# Patient Record
Sex: Female | Born: 1960 | Race: Black or African American | Hispanic: No | Smoking: Current some day smoker
Health system: Southern US, Community
[De-identification: ages and names within clinical notes are randomized; demographics above are authoritative.]

## PROBLEM LIST (undated history)

## (undated) DIAGNOSIS — R7303 Prediabetes: Secondary | ICD-10-CM

## (undated) DIAGNOSIS — F32A Depression, unspecified: Secondary | ICD-10-CM

## (undated) DIAGNOSIS — I10 Essential (primary) hypertension: Secondary | ICD-10-CM

## (undated) DIAGNOSIS — J45909 Unspecified asthma, uncomplicated: Secondary | ICD-10-CM

## (undated) DIAGNOSIS — F329 Major depressive disorder, single episode, unspecified: Secondary | ICD-10-CM

## (undated) DIAGNOSIS — F419 Anxiety disorder, unspecified: Secondary | ICD-10-CM

## (undated) DIAGNOSIS — K7460 Unspecified cirrhosis of liver: Secondary | ICD-10-CM

## (undated) DIAGNOSIS — J189 Pneumonia, unspecified organism: Secondary | ICD-10-CM

## (undated) DIAGNOSIS — K5900 Constipation, unspecified: Secondary | ICD-10-CM

## (undated) DIAGNOSIS — K739 Chronic hepatitis, unspecified: Secondary | ICD-10-CM

## (undated) DIAGNOSIS — E7800 Pure hypercholesterolemia, unspecified: Secondary | ICD-10-CM

## (undated) DIAGNOSIS — M1990 Unspecified osteoarthritis, unspecified site: Secondary | ICD-10-CM

## (undated) DIAGNOSIS — K219 Gastro-esophageal reflux disease without esophagitis: Secondary | ICD-10-CM

## (undated) HISTORY — PX: ABDOMINAL HYSTERECTOMY: SHX81

---

## 2004-07-20 ENCOUNTER — Emergency Department: Payer: Self-pay | Admitting: Internal Medicine

## 2010-01-19 ENCOUNTER — Ambulatory Visit: Payer: Self-pay

## 2010-02-12 ENCOUNTER — Emergency Department: Payer: Self-pay | Admitting: Emergency Medicine

## 2010-04-02 ENCOUNTER — Ambulatory Visit: Payer: Self-pay | Admitting: Adult Health

## 2010-04-02 ENCOUNTER — Inpatient Hospital Stay: Payer: Self-pay | Admitting: Internal Medicine

## 2010-04-22 ENCOUNTER — Emergency Department: Payer: Self-pay | Admitting: Internal Medicine

## 2010-05-07 ENCOUNTER — Ambulatory Visit: Payer: Self-pay

## 2010-06-06 ENCOUNTER — Emergency Department: Payer: Self-pay | Admitting: Emergency Medicine

## 2012-01-12 IMAGING — CR DG CHEST 2V
1 series · 2 of 2 positions shown · non-contrast
Comparison: none

REASON FOR EXAM: chest pain
COMMENTS:

PROCEDURE:     DXR - DXR CHEST PA (OR AP) AND LATERAL  - January 19, 2010  [DATE]
RESULT:     Comparison: None

[Series 1: view not recorded · 0.17mm/px · 2 of 2 slices shown]
[im 1/2]
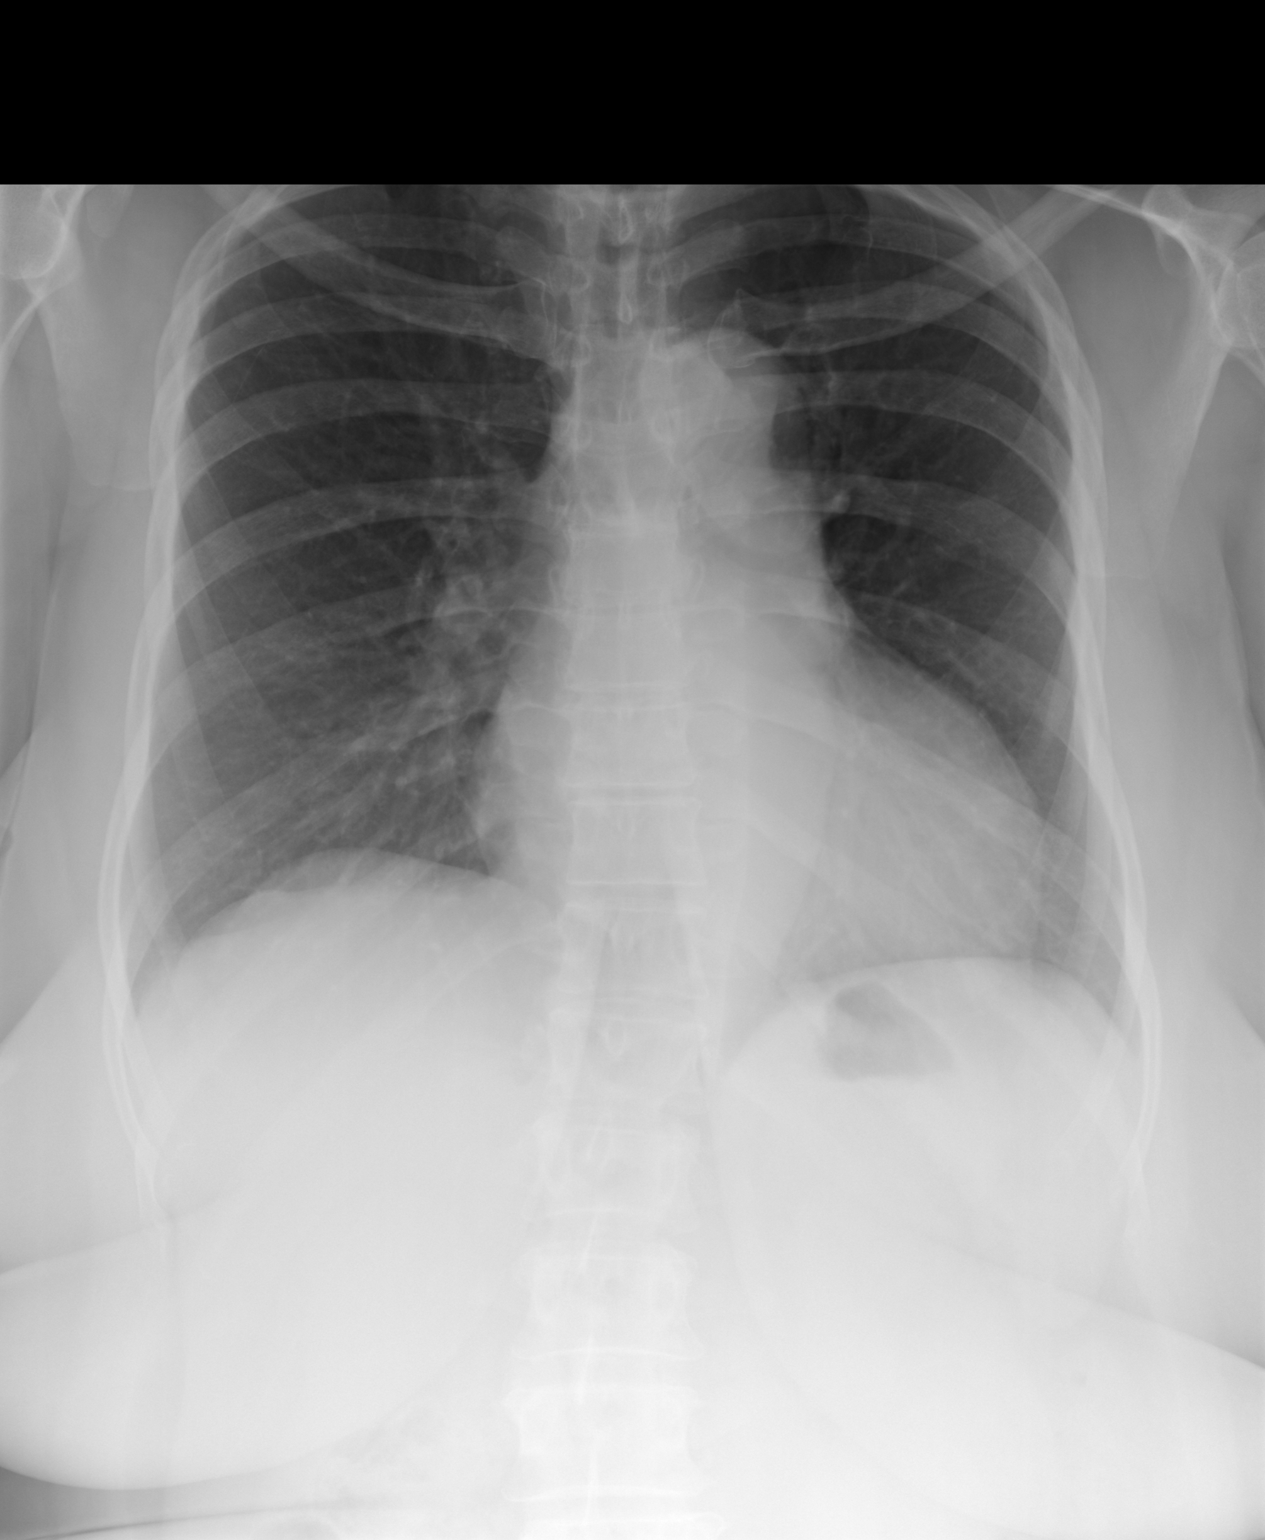
[im 2/2]
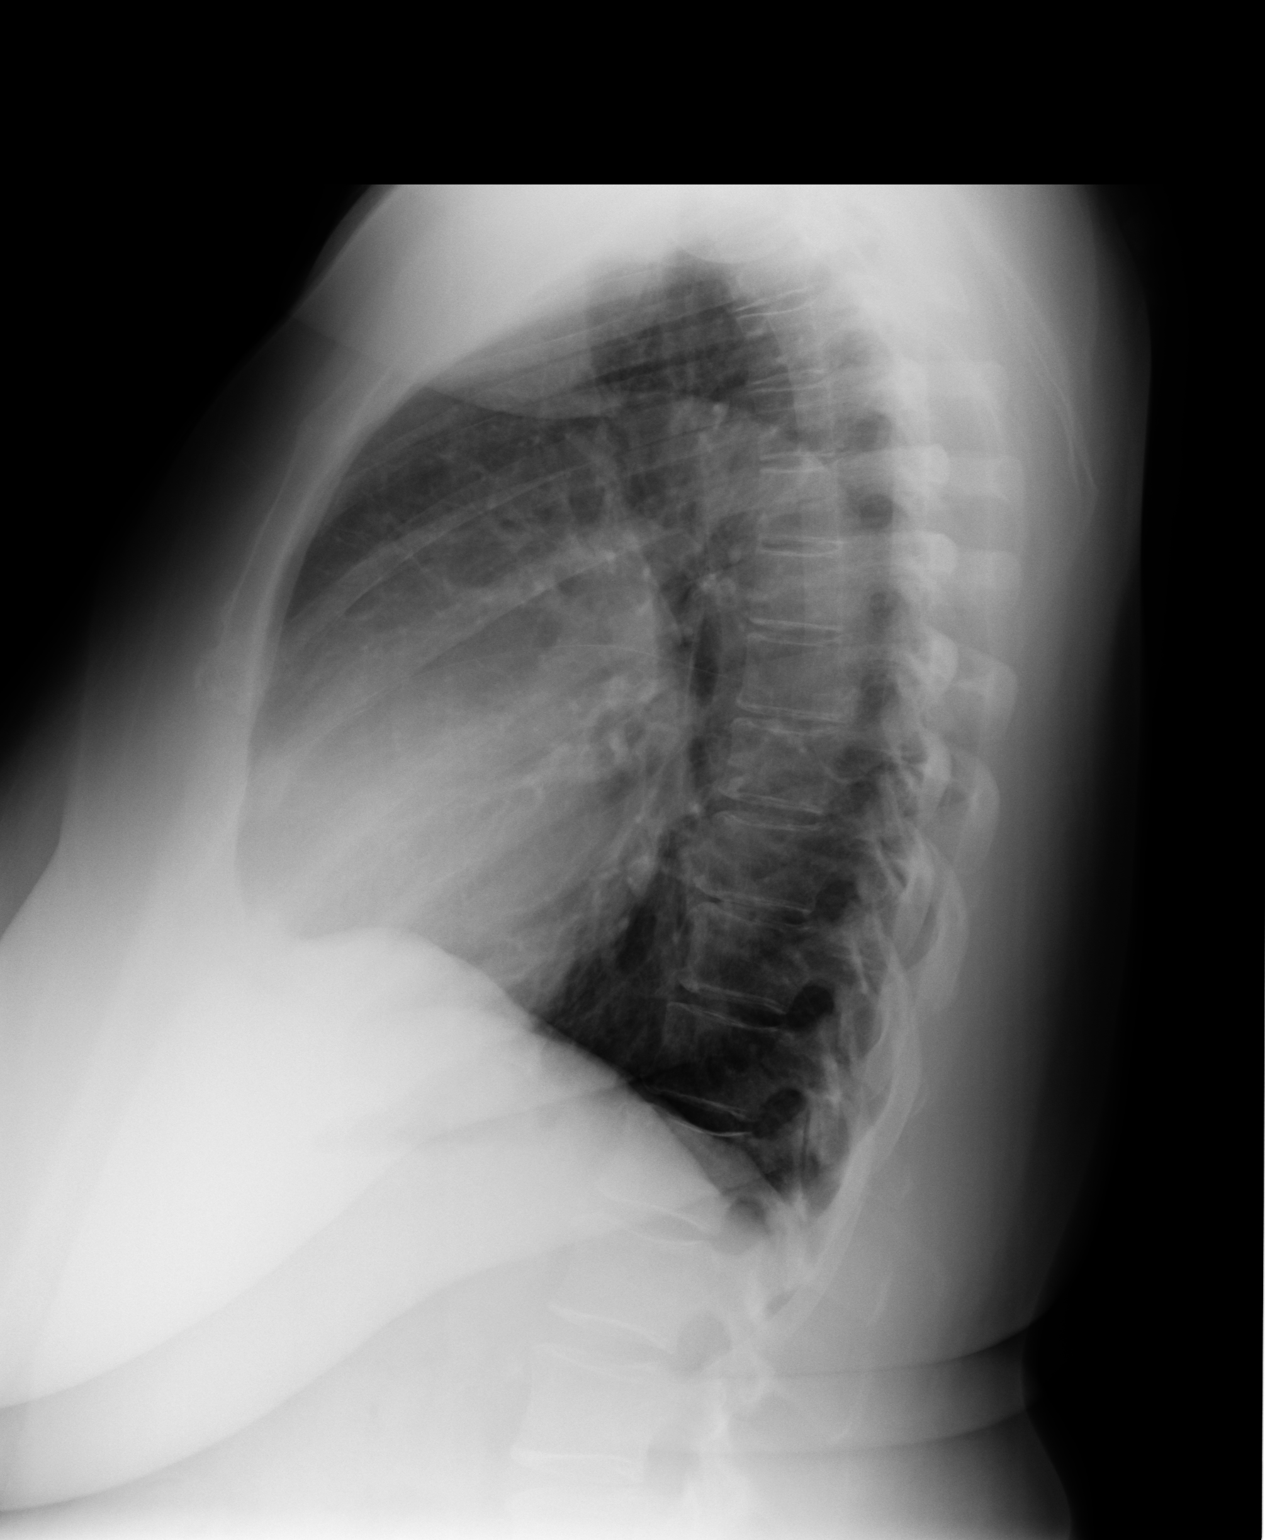

[2 of 2 positions shown; findings below may reference images not displayed]

FINDINGS: PA and lateral chest radiographs are provided. There is no focal parenchymal
opacity, pleural effusion, or pneumothorax. The heart and mediastinum are
unremarkable. The osseous structures are unremarkable.
IMPRESSION: No acute disease of the chest.

## 2012-04-27 ENCOUNTER — Ambulatory Visit: Payer: Self-pay | Admitting: Emergency Medicine

## 2012-04-30 LAB — PATHOLOGY REPORT

## 2012-07-15 ENCOUNTER — Ambulatory Visit: Payer: Self-pay | Admitting: Emergency Medicine

## 2012-12-06 ENCOUNTER — Ambulatory Visit: Payer: Self-pay | Admitting: Family Medicine

## 2014-05-24 ENCOUNTER — Ambulatory Visit: Payer: Self-pay | Admitting: Family Medicine

## 2014-08-24 ENCOUNTER — Encounter: Payer: Self-pay | Admitting: Nurse Practitioner

## 2014-08-28 ENCOUNTER — Encounter: Payer: Self-pay | Admitting: Nurse Practitioner

## 2015-04-16 ENCOUNTER — Other Ambulatory Visit: Payer: Self-pay | Admitting: Nurse Practitioner

## 2015-04-16 DIAGNOSIS — F17200 Nicotine dependence, unspecified, uncomplicated: Secondary | ICD-10-CM

## 2015-04-16 DIAGNOSIS — R0602 Shortness of breath: Secondary | ICD-10-CM

## 2015-04-24 ENCOUNTER — Ambulatory Visit
Admission: RE | Admit: 2015-04-24 | Discharge: 2015-04-24 | Disposition: A | Discharge status: Home / Self Care | Payer: Medicaid Other | Source: Ambulatory Visit | Attending: Nurse Practitioner | Admitting: Nurse Practitioner

## 2015-04-24 DIAGNOSIS — F17210 Nicotine dependence, cigarettes, uncomplicated: Secondary | ICD-10-CM | POA: Insufficient documentation

## 2015-04-24 DIAGNOSIS — F17200 Nicotine dependence, unspecified, uncomplicated: Secondary | ICD-10-CM

## 2015-04-24 DIAGNOSIS — R0602 Shortness of breath: Secondary | ICD-10-CM | POA: Diagnosis not present

## 2015-10-19 ENCOUNTER — Other Ambulatory Visit: Payer: Self-pay | Admitting: Preventative Medicine

## 2015-10-19 DIAGNOSIS — Z1231 Encounter for screening mammogram for malignant neoplasm of breast: Secondary | ICD-10-CM

## 2015-11-06 ENCOUNTER — Ambulatory Visit: Payer: Medicaid Other | Attending: Preventative Medicine

## 2015-12-04 ENCOUNTER — Other Ambulatory Visit: Payer: Self-pay | Admitting: Student

## 2015-12-04 DIAGNOSIS — B182 Chronic viral hepatitis C: Secondary | ICD-10-CM

## 2015-12-11 ENCOUNTER — Ambulatory Visit
Admission: RE | Admit: 2015-12-11 | Discharge: 2015-12-11 | Disposition: A | Discharge status: Home / Self Care | Payer: Medicaid Other | Source: Ambulatory Visit | Attending: Student | Admitting: Student

## 2015-12-11 DIAGNOSIS — N281 Cyst of kidney, acquired: Secondary | ICD-10-CM | POA: Insufficient documentation

## 2015-12-11 DIAGNOSIS — K824 Cholesterolosis of gallbladder: Secondary | ICD-10-CM | POA: Insufficient documentation

## 2015-12-11 DIAGNOSIS — B182 Chronic viral hepatitis C: Secondary | ICD-10-CM | POA: Insufficient documentation

## 2016-03-21 ENCOUNTER — Encounter: Payer: Self-pay | Admitting: *Deleted

## 2016-03-24 ENCOUNTER — Ambulatory Visit: Payer: Medicaid Other | Admitting: Anesthesiology

## 2016-03-24 ENCOUNTER — Encounter
Admission: RE | Disposition: A | Discharge status: Home / Self Care | Payer: Self-pay | Source: Ambulatory Visit | Attending: Unknown Physician Specialty

## 2016-03-24 ENCOUNTER — Ambulatory Visit
Admission: RE | Admit: 2016-03-24 | Discharge: 2016-03-24 | Disposition: A | Discharge status: Home / Self Care | Payer: Medicaid Other | Source: Ambulatory Visit | Attending: Unknown Physician Specialty | Admitting: Unknown Physician Specialty

## 2016-03-24 ENCOUNTER — Encounter: Payer: Self-pay | Admitting: *Deleted

## 2016-03-24 DIAGNOSIS — R1310 Dysphagia, unspecified: Secondary | ICD-10-CM | POA: Insufficient documentation

## 2016-03-24 DIAGNOSIS — Z79899 Other long term (current) drug therapy: Secondary | ICD-10-CM | POA: Diagnosis not present

## 2016-03-24 DIAGNOSIS — M1990 Unspecified osteoarthritis, unspecified site: Secondary | ICD-10-CM | POA: Insufficient documentation

## 2016-03-24 DIAGNOSIS — I10 Essential (primary) hypertension: Secondary | ICD-10-CM | POA: Insufficient documentation

## 2016-03-24 DIAGNOSIS — F419 Anxiety disorder, unspecified: Secondary | ICD-10-CM | POA: Insufficient documentation

## 2016-03-24 DIAGNOSIS — E669 Obesity, unspecified: Secondary | ICD-10-CM | POA: Insufficient documentation

## 2016-03-24 DIAGNOSIS — Z8619 Personal history of other infectious and parasitic diseases: Secondary | ICD-10-CM | POA: Insufficient documentation

## 2016-03-24 DIAGNOSIS — F17200 Nicotine dependence, unspecified, uncomplicated: Secondary | ICD-10-CM | POA: Diagnosis not present

## 2016-03-24 DIAGNOSIS — Z7982 Long term (current) use of aspirin: Secondary | ICD-10-CM | POA: Insufficient documentation

## 2016-03-24 DIAGNOSIS — K5900 Constipation, unspecified: Secondary | ICD-10-CM | POA: Insufficient documentation

## 2016-03-24 DIAGNOSIS — K2970 Gastritis, unspecified, without bleeding: Secondary | ICD-10-CM | POA: Insufficient documentation

## 2016-03-24 DIAGNOSIS — Z6823 Body mass index (BMI) 23.0-23.9, adult: Secondary | ICD-10-CM | POA: Diagnosis not present

## 2016-03-24 DIAGNOSIS — F329 Major depressive disorder, single episode, unspecified: Secondary | ICD-10-CM | POA: Insufficient documentation

## 2016-03-24 DIAGNOSIS — K259 Gastric ulcer, unspecified as acute or chronic, without hemorrhage or perforation: Secondary | ICD-10-CM | POA: Diagnosis not present

## 2016-03-24 DIAGNOSIS — F17210 Nicotine dependence, cigarettes, uncomplicated: Secondary | ICD-10-CM | POA: Insufficient documentation

## 2016-03-24 DIAGNOSIS — R7303 Prediabetes: Secondary | ICD-10-CM | POA: Insufficient documentation

## 2016-03-24 DIAGNOSIS — E7800 Pure hypercholesterolemia, unspecified: Secondary | ICD-10-CM | POA: Diagnosis not present

## 2016-03-24 HISTORY — DX: Major depressive disorder, single episode, unspecified: F32.9

## 2016-03-24 HISTORY — DX: Anxiety disorder, unspecified: F41.9

## 2016-03-24 HISTORY — DX: Unspecified osteoarthritis, unspecified site: M19.90

## 2016-03-24 HISTORY — DX: Prediabetes: R73.03

## 2016-03-24 HISTORY — DX: Constipation, unspecified: K59.00

## 2016-03-24 HISTORY — DX: Chronic hepatitis, unspecified: K73.9

## 2016-03-24 HISTORY — DX: Pure hypercholesterolemia, unspecified: E78.00

## 2016-03-24 HISTORY — DX: Depression, unspecified: F32.A

## 2016-03-24 HISTORY — PX: ESOPHAGOGASTRODUODENOSCOPY (EGD) WITH PROPOFOL: SHX5813

## 2016-03-24 HISTORY — DX: Essential (primary) hypertension: I10

## 2016-03-24 SURGERY — ESOPHAGOGASTRODUODENOSCOPY (EGD) WITH PROPOFOL
Anesthesia: General

## 2016-03-24 MED ORDER — PROPOFOL 10 MG/ML IV BOLUS
INTRAVENOUS | Status: DC | PRN
  Administered 2016-03-24: 40 mg via INTRAVENOUS

## 2016-03-24 MED ORDER — SODIUM CHLORIDE 0.9 % IV SOLN
INTRAVENOUS | Status: DC
  Administered 2016-03-24: 13:00:00 via INTRAVENOUS

## 2016-03-24 MED ORDER — FENTANYL CITRATE (PF) 100 MCG/2ML IJ SOLN
INTRAMUSCULAR | Status: DC | PRN
  Administered 2016-03-24: 50 ug via INTRAVENOUS

## 2016-03-24 MED ORDER — PROPOFOL 500 MG/50ML IV EMUL
INTRAVENOUS | Status: DC | PRN
  Administered 2016-03-24: 100 ug/kg/min via INTRAVENOUS

## 2016-03-24 MED ORDER — SODIUM CHLORIDE 0.9 % IV SOLN: INTRAVENOUS | Status: DC

## 2016-03-24 MED ORDER — BUTAMBEN-TETRACAINE-BENZOCAINE 2-2-14 % EX AERO
INHALATION_SPRAY | CUTANEOUS | Status: DC | PRN
  Administered 2016-03-24: 1 via TOPICAL

## 2016-03-24 MED ORDER — LIDOCAINE HCL (CARDIAC) 20 MG/ML IV SOLN
INTRAVENOUS | Status: DC | PRN
  Administered 2016-03-24: 100 mg via INTRAVENOUS

## 2016-03-24 MED ORDER — GLYCOPYRROLATE 0.2 MG/ML IJ SOLN
INTRAMUSCULAR | Status: DC | PRN
  Administered 2016-03-24: 0.2 mg via INTRAVENOUS

## 2016-03-24 MED ORDER — DEXMEDETOMIDINE HCL 200 MCG/2ML IV SOLN
INTRAVENOUS | Status: DC | PRN
  Administered 2016-03-24: 12 ug via INTRAVENOUS

## 2016-03-24 MED ORDER — BUTAMBEN-TETRACAINE-BENZOCAINE 2-2-14 % EX AERO
INHALATION_SPRAY | CUTANEOUS | Status: AC
  Filled 2016-03-24: qty 20

## 2016-03-24 NOTE — H&P (Signed)
   Primary Care Physician:  Veneda MelterWHITE, CHRISTINA M, FNP Primary Gastroenterologist:  Dr. Mechele CollinElliott  Pre-Procedure History & Physical: HPI:  Donna ComeLillian Marie Phelps is a 55 y.o. female is here for an endoscopy.   Past Medical History:  Diagnosis Date  . Anxiety   . Arthritis   . Chronic hepatitis (HCC)   . Constipation   . Depression   . High cholesterol   . Hypertension   . Prediabetes     History reviewed. No pertinent surgical history.  Prior to Admission medications   Medication Sig Start Date End Date Taking? Authorizing Provider  albuterol (PROVENTIL HFA;VENTOLIN HFA) 108 (90 Base) MCG/ACT inhaler Inhale into the lungs daily.   Yes Historical Provider, MD  aspirin EC 81 MG tablet Take 81 mg by mouth daily.   Yes Historical Provider, MD  beclomethasone (QVAR) 40 MCG/ACT inhaler Inhale into the lungs 2 (two) times daily.   Yes Historical Provider, MD  lisinopril-hydrochlorothiazide (PRINZIDE,ZESTORETIC) 20-12.5 MG tablet Take 1 tablet by mouth daily.   Yes Historical Provider, MD  fluconazole (DIFLUCAN) 200 MG tablet Take 200 mg by mouth daily.    Historical Provider, MD  naproxen (NAPROSYN) 500 MG tablet Take 500 mg by mouth 2 (two) times daily with a meal.    Historical Provider, MD    Allergies as of 03/19/2016  . (Not on File)    History reviewed. No pertinent family history.  Social History   Social History  . Marital status: Legally Separated    Spouse name: N/A  . Number of children: N/A  . Years of education: N/A   Occupational History  . Not on file.   Social History Main Topics  . Smoking status: Current Every Day Smoker    Packs/day: 0.25    Years: 15.00    Types: Cigarettes  . Smokeless tobacco: Never Used  . Alcohol use No  . Drug use: No  . Sexual activity: Not on file   Other Topics Concern  . Not on file   Social History Narrative  . No narrative on file    Review of Systems: See HPI, otherwise negative ROS  Physical Exam: BP 124/78    Pulse (!) 56   Temp 97 F (36.1 C) (Tympanic)   Resp 20   Ht 5\' 7"  (1.702 m)   Wt 68 kg (150 lb)   SpO2 99%   BMI 23.49 kg/m  General:   Alert,  pleasant and cooperative in NAD Head:  Normocephalic and atraumatic. Neck:  Supple; no masses or thyromegaly. Lungs:  Clear throughout to auscultation.    Heart:  Regular rate and rhythm. Abdomen:  Soft, nontender and nondistended. Normal bowel sounds, without guarding, and without rebound.   Neurologic:  Alert and  oriented x4;  grossly normal neurologically.  Impression/Plan: Donna ComeLillian Marie Phelps is here for an endoscopy to be performed for dysphagia, epigastric abd pain.  Risks, benefits, limitations, and alternatives regarding  endoscopy have been reviewed with the patient.  Questions have been answered.  All parties agreeable.   Lynnae PrudeELLIOTT, ROBERT, MD  03/24/2016, 1:52 PM

## 2016-03-24 NOTE — Anesthesia Postprocedure Evaluation (Signed)
Anesthesia Post Note  Patient: Simonne ComeLillian Marie Neisen  Procedure(s) Performed: Procedure(s) (LRB): ESOPHAGOGASTRODUODENOSCOPY (EGD) WITH PROPOFOL (N/A)  Patient location during evaluation: PACU Anesthesia Type: General Level of consciousness: awake and alert Pain management: pain level controlled Vital Signs Assessment: post-procedure vital signs reviewed and stable Respiratory status: spontaneous breathing, nonlabored ventilation, respiratory function stable and patient connected to nasal cannula oxygen Cardiovascular status: blood pressure returned to baseline and stable Postop Assessment: no signs of nausea or vomiting Anesthetic complications: no    Last Vitals:  Vitals:   03/24/16 1251 03/24/16 1420  BP: 124/78 108/74  Pulse: (!) 56 (!) 55  Resp: 20 10  Temp: 36.1 C (!) 36 C    Last Pain:  Vitals:   03/24/16 1420  TempSrc: Tympanic                 Yevette EdwardsJames G Ellan Tess

## 2016-03-24 NOTE — Op Note (Signed)
[facility] Gastroenterology Patient Name: Donna BlonderLillian Phelps Procedure Date: 03/24/2016 1:41 PM MRN: [id number] Account #: [id number] Date of Birth: [birth year] Admit Type: Outpatient Age: [age] [facility]OM [facility] Gender: Female Note Status: Finalized Procedure:            Upper GI endoscopy Indications:          Epigastric abdominal pain, Dysphagia, Heartburn Providers:            Scot Junobert T. Elliott, MD Referring MD:         Valentina Shaggyhristina M. White (Referring MD) Medicines:            Propofol per Anesthesia Complications:        No immediate complications. Procedure:            Pre-Anesthesia Assessment:                       - After reviewing the risks and benefits, the patient                        was deemed in satisfactory condition to undergo the                        procedure.                       After obtaining informed consent, the endoscope was                        passed under direct vision. Throughout the procedure,                        the patient's blood pressure, pulse, and oxygen                        saturations were monitored continuously. The Endoscope                        was introduced through the mouth, and advanced to the                        second part of duodenum. The upper GI endoscopy was                        accomplished without difficulty. The patient tolerated                        the procedure well. Findings:      The examined esophagus was normal. GEJ 38cm.      Five non-bleeding cratered and superficial gastric ulcers with no       stigmata of bleeding were found in the gastric antrum. The largest       lesion was 6 mm in largest dimension.      Localized moderate inflammation characterized by erythema and       granularity was found in the gastric antrum. Biopsies were taken with a       cold forceps for histology. Biopsies were taken with a cold forceps for       Helicobacter pylori testing.      The examined  duodenum was normal. Impression:           - Normal  esophagus.                       - Non-bleeding gastric ulcers with no stigmata of                        bleeding.                       - Gastritis. Biopsied.                       - Normal examined duodenum. Recommendation:       - Await pathology results. Scot Jun, MD 03/24/2016 2:13:19 PM This report has been signed electronically. Number of Addenda: 0 Note Initiated On: 03/24/2016 1:41 PM      [facility]

## 2016-03-24 NOTE — Transfer of Care (Signed)
Immediate Anesthesia Transfer of Care Note  Patient: Donna Phelps  Procedure(s) Performed: Procedure(s): ESOPHAGOGASTRODUODENOSCOPY (EGD) WITH PROPOFOL (N/A)  Patient Location: PACU  Anesthesia Type:General  Level of Consciousness: sedated  Airway & Oxygen Therapy: Patient Spontanous Breathing and Patient connected to nasal cannula oxygen  Post-op Assessment: Report given to RN and Post -op Vital signs reviewed and stable  Post vital signs: Reviewed and stable  Last Vitals:  Vitals:   03/24/16 1251  BP: 124/78  Pulse: (!) 56  Resp: 20  Temp: 36.1 C    Last Pain:  Vitals:   03/24/16 1251  TempSrc: Tympanic         Complications: No apparent anesthesia complications

## 2016-03-24 NOTE — Anesthesia Preprocedure Evaluation (Signed)
Anesthesia Evaluation  Patient identified by MRN, date of birth, ID band Patient awake    Reviewed: Allergy & Precautions, H&P , NPO status , Patient's Chart, lab work & pertinent test results, reviewed documented beta blocker date and time   History of Anesthesia Complications Negative for: history of anesthetic complications  Airway Mallampati: III  TM Distance: >3 FB Neck ROM: full    Dental no notable dental hx. (+) Missing, Poor Dentition, Chipped   Pulmonary neg shortness of breath, neg sleep apnea, neg COPD, neg recent URI, Current Smoker,    Pulmonary exam normal breath sounds clear to auscultation       Cardiovascular Exercise Tolerance: Good hypertension, On Medications (-) angina(-) CAD, (-) Past MI, (-) Cardiac Stents and (-) CABG Normal cardiovascular exam(-) dysrhythmias (-) Valvular Problems/Murmurs Rhythm:regular Rate:Normal     Neuro/Psych PSYCHIATRIC DISORDERS (Depression) negative neurological ROS     GI/Hepatic GERD  ,(+) Hepatitis -  Endo/Other  diabetes (Borderline)  Renal/GU negative Renal ROS  negative genitourinary   Musculoskeletal   Abdominal   Peds  Hematology negative hematology ROS (+)   Anesthesia Other Findings Past Medical History: No date: Anxiety No date: Arthritis No date: Chronic hepatitis (HCC) No date: Constipation No date: Depression No date: High cholesterol No date: Hypertension No date: Prediabetes   Reproductive/Obstetrics negative OB ROS                             Anesthesia Physical Anesthesia Plan  ASA: III  Anesthesia Plan: General   Post-op Pain Management:    Induction:   Airway Management Planned:   Additional Equipment:   Intra-op Plan:   Post-operative Plan:   Informed Consent: I have reviewed the patients History and Physical, chart, labs and discussed the procedure including the risks, benefits and alternatives  for the proposed anesthesia with the patient or authorized representative who has indicated his/her understanding and acceptance.   Dental Advisory Given  Plan Discussed with: Anesthesiologist, CRNA and Surgeon  Anesthesia Plan Comments:         Anesthesia Quick Evaluation

## 2016-03-24 NOTE — Transfer of Care (Signed)
Immediate Anesthesia Transfer of Care Note  Patient: Donna Phelps  Procedure(s) Performed: Procedure(s): ESOPHAGOGASTRODUODENOSCOPY (EGD) WITH PROPOFOL (N/A)  Patient Location: PACU  Anesthesia Type:General  Level of Consciousness: awake, alert  and oriented  Airway & Oxygen Therapy: Patient Spontanous Breathing and Patient connected to nasal cannula oxygen  Post-op Assessment: Report given to RN and Post -op Vital signs reviewed and stable  Post vital signs: Reviewed and stable  Last Vitals:  Vitals:   03/24/16 1251  BP: 124/78  Pulse: (!) 56  Resp: 20  Temp: 36.1 C    Last Pain:  Vitals:   03/24/16 1251  TempSrc: Tympanic         Complications: No apparent anesthesia complications

## 2016-04-02 LAB — SURGICAL PATHOLOGY

## 2016-05-08 ENCOUNTER — Encounter: Payer: Self-pay | Admitting: Unknown Physician Specialty

## 2016-05-10 ENCOUNTER — Emergency Department
Admission: EM | Admit: 2016-05-10 | Discharge: 2016-05-10 | Disposition: A | Discharge status: Home / Self Care | Payer: Medicaid Other | Attending: Emergency Medicine | Admitting: Emergency Medicine

## 2016-05-10 ENCOUNTER — Encounter: Payer: Self-pay | Admitting: Emergency Medicine

## 2016-05-10 ENCOUNTER — Emergency Department: Payer: Medicaid Other

## 2016-05-10 DIAGNOSIS — I10 Essential (primary) hypertension: Secondary | ICD-10-CM | POA: Insufficient documentation

## 2016-05-10 DIAGNOSIS — Z7982 Long term (current) use of aspirin: Secondary | ICD-10-CM | POA: Insufficient documentation

## 2016-05-10 DIAGNOSIS — H60501 Unspecified acute noninfective otitis externa, right ear: Secondary | ICD-10-CM | POA: Insufficient documentation

## 2016-05-10 DIAGNOSIS — H9201 Otalgia, right ear: Secondary | ICD-10-CM | POA: Diagnosis present

## 2016-05-10 DIAGNOSIS — F17210 Nicotine dependence, cigarettes, uncomplicated: Secondary | ICD-10-CM | POA: Insufficient documentation

## 2016-05-10 DIAGNOSIS — Z79899 Other long term (current) drug therapy: Secondary | ICD-10-CM | POA: Insufficient documentation

## 2016-05-10 DIAGNOSIS — L03211 Cellulitis of face: Secondary | ICD-10-CM | POA: Insufficient documentation

## 2016-05-10 LAB — CBC
HCT: 40.1 % (ref 35.0–47.0)
Hemoglobin: 13.8 g/dL (ref 12.0–16.0)
MCH: 29.4 pg (ref 26.0–34.0)
MCHC: 34.4 g/dL (ref 32.0–36.0)
MCV: 85.6 fL (ref 80.0–100.0)
Platelets: 168 10*3/uL (ref 150–440)
RBC: 4.69 MIL/uL (ref 3.80–5.20)
RDW: 13.7 % (ref 11.5–14.5)
WBC: 9.9 10*3/uL (ref 3.6–11.0)

## 2016-05-10 LAB — BASIC METABOLIC PANEL
Anion gap: 7 (ref 5–15)
BUN: 12 mg/dL (ref 6–20)
CO2: 28 mmol/L (ref 22–32)
Calcium: 9.6 mg/dL (ref 8.9–10.3)
Chloride: 103 mmol/L (ref 101–111)
Creatinine, Ser: 0.82 mg/dL (ref 0.44–1.00)
GFR calc Af Amer: >60 mL/min (ref >60)
GFR calc non Af Amer: >60 mL/min (ref >60)
Glucose, Bld: 103 mg/dL — ABNORMAL HIGH (ref 65–99)
Potassium: 3.4 mmol/L — ABNORMAL LOW (ref 3.5–5.1)
Sodium: 138 mmol/L (ref 135–145)

## 2016-05-10 MED ORDER — CLINDAMYCIN HCL 300 MG PO CAPS: 300.0000 mg | ORAL_CAPSULE | Freq: Three times a day (TID) | ORAL | 0 refills | Status: AC

## 2016-05-10 MED ORDER — OXYCODONE-ACETAMINOPHEN 5-325 MG PO TABS: 1.0000 | ORAL_TABLET | Freq: Four times a day (QID) | ORAL | 0 refills | Status: DC | PRN

## 2016-05-10 MED ORDER — IOPAMIDOL (ISOVUE-300) INJECTION 61%
75.0000 mL | Freq: Once | INTRAVENOUS | Status: AC | PRN
  Administered 2016-05-10: 75 mL via INTRAVENOUS
  Filled 2016-05-10: qty 75

## 2016-05-10 MED ORDER — MORPHINE SULFATE (PF) 4 MG/ML IV SOLN
4.0000 mg | Freq: Once | INTRAVENOUS | Status: AC
  Administered 2016-05-10: 4 mg via INTRAVENOUS
  Filled 2016-05-10: qty 1

## 2016-05-10 MED ORDER — PIPERACILLIN-TAZOBACTAM 3.375 G IVPB 30 MIN
3.3750 g | Freq: Once | INTRAVENOUS | Status: AC
  Administered 2016-05-10: 3.375 g via INTRAVENOUS
  Filled 2016-05-10: qty 50

## 2016-05-10 MED ORDER — MORPHINE SULFATE (PF) 4 MG/ML IV SOLN
INTRAVENOUS | Status: AC
  Administered 2016-05-10: 4 mg via INTRAVENOUS
  Filled 2016-05-10: qty 1

## 2016-05-10 MED ORDER — OFLOXACIN 0.3 % OP SOLN: OPHTHALMIC | 0 refills | Status: AC

## 2016-05-10 MED ORDER — IOPAMIDOL (ISOVUE-300) INJECTION 61%
100.0000 mL | Freq: Once | INTRAVENOUS | Status: DC | PRN
  Filled 2016-05-10: qty 100

## 2016-05-10 MED ORDER — MORPHINE SULFATE (PF) 4 MG/ML IV SOLN
4.0000 mg | Freq: Once | INTRAVENOUS | Status: AC
  Administered 2016-05-10: 4 mg via INTRAVENOUS

## 2016-05-10 NOTE — ED Provider Notes (Signed)
[facility] -  Emergency Department Provider Note  Time seen: 9:47 AM  I have reviewed the triage vital signs and the nursing notes.   HISTORY  Chief Complaint Otalgia    HPI Donna Phelps is a 55 y.o. female with a past medical history of hypertension, hyperlipidemia, who presents the emergency department for right ear pain. According to the patient for the past one week she has been having progressively worsening right ear pain. States drainage of blood and pus from the right ear since yesterday. Denies fever. Denies vomiting. Patient states the pain is moderate to severe in an around the right ear. States her hearing is muffled out of the right ear. Denies any vomiting.  Past Medical History:  Diagnosis Date  . Anxiety   . Arthritis   . Chronic hepatitis (HCC)   . Constipation   . Depression   . High cholesterol   . Hypertension   . Prediabetes     There are no active problems to display for this patient.   Past Surgical History:  Procedure Laterality Date  . ESOPHAGOGASTRODUODENOSCOPY (EGD) WITH PROPOFOL N/A 03/24/2016   Procedure: ESOPHAGOGASTRODUODENOSCOPY (EGD) WITH PROPOFOL;  Surgeon: Scot Jun, MD;  Location: [facility] ENDOSCOPY;  Service: Endoscopy;  Laterality: N/A;    Prior to Admission medications   Medication Sig Start Date End Date Taking? Authorizing Provider  albuterol (PROVENTIL HFA;VENTOLIN HFA) 108 (90 Base) MCG/ACT inhaler Inhale into the lungs daily.    Historical Provider, MD  aspirin EC 81 MG tablet Take 81 mg by mouth daily.    Historical Provider, MD  beclomethasone (QVAR) 40 MCG/ACT inhaler Inhale into the lungs 2 (two) times daily.    Historical Provider, MD  fluconazole (DIFLUCAN) 200 MG tablet Take 200 mg by mouth daily.    Historical Provider, MD  lisinopril-hydrochlorothiazide (PRINZIDE,ZESTORETIC) 20-12.5 MG tablet Take 1 tablet by mouth daily.    Historical Provider, MD  naproxen (NAPROSYN) 500 MG tablet Take  500 mg by mouth 2 (two) times daily with a meal.    Historical Provider, MD    No Known Allergies  No family history on file.  Social History Social History  Substance Use Topics  . Smoking status: Current Every Day Smoker    Packs/day: 0.25    Years: 15.00    Types: Cigarettes  . Smokeless tobacco: Never Used  . Alcohol use No    Review of Systems Constitutional: Negative for fever. ENT: Positive right ear pain and drainage. Cardiovascular: Negative for chest pain. Respiratory: Negative for shortness of breath. Gastrointestinal: Negative for abdominal pain Skin: Negative for rash. Neurological: Mild headache. 10-point ROS otherwise negative.  ____________________________________________   PHYSICAL EXAM:  VITAL SIGNS: ED Triage Vitals [05/10/16 0910]  Enc Vitals Group     BP (!) 150/94     Pulse Rate 69     Resp 20     Temp 99.3 F (37.4 C)     Temp Source Oral     SpO2 97 %     Weight 165 lb (74.8 kg)     Height 5\' 6"  (1.676 m)     Head Circumference      Peak Flow      Pain Score 10     Pain Loc      Pain Edu?      Excl. in GC?     Constitutional: Alert and oriented. Well appearing and in no distress. Eyes: Normal exam ENT   Head: Normocephalic. Moderate tenderness to  palpation around the right ear, with moderate edema, mild erythema especially anterior to the right ear. Auditory canal is swollen due to the swelling anterior to the right ear. Mild tenderness to palpation over the mastoid process. There appears to be blood and exudate draining from the right ear.   Nose: No congestion/rhinnorhea.   Mouth/Throat: Mucous membranes are moist. Cardiovascular: Normal rate, regular rhythm. No murmur Respiratory: Normal respiratory effort without tachypnea nor retractions. Breath sounds are clear Gastrointestinal: Soft and nontender. No distention.  Musculoskeletal: Nontender with normal range of motion in all extremities.  Neurologic:  Normal speech  and language. No gross focal neurologic deficits  Skin:  Skin is warm, dry and intact.  Psychiatric: Mood and affect are normal.  ____________________________________________   RADIOLOGY  CT shows lesion in the right ear canal, infectious versus inflammatory.  ____________________________________________   INITIAL IMPRESSION / ASSESSMENT AND PLAN / ED COURSE  Pertinent labs & imaging results that were available during my care of the patient were reviewed by me and considered in my medical decision making (see chart for details).  Patient appears to have an infection of an around the right ear, however given the degree of tenderness and swelling anterior to the auditory canal with tenderness over the mastoid process we will proceed with a CT of the face with contrast to evaluate for abscess. We will treat with pain medication, pain labs and a CT scan to further evaluate. Patient is agreeable to this plan.  CT appears to show infectious lesion. Lesion is spontaneously draining. We'll place the patient on clindamycin 3 times daily for the next 10 days have her follow-up with a primary care doctor in 2-3 days for recheck since reevaluation. I discussed strict return precautions, the patient is agreeable.  ____________________________________________   FINAL CLINICAL IMPRESSION(S) / ED DIAGNOSES  Right ear infection    Minna AntisKevin Dallen Bunte, MD 05/10/16 1202

## 2016-05-10 NOTE — Discharge Instructions (Signed)
Please follow-up your primary care doctor in 2-3 days for recheck/reevaluation. Return to the emergency department for any worsening pain, fever, or any other symptom personally concerning to your self. Please take your antibiotics and use her antibiotic drops as prescribed for their entire course.

## 2016-05-10 NOTE — ED Triage Notes (Signed)
Patient presents to the ED with with right ear pain x 1 week with small amount of bloody drainage coming from right ear.  Patient states, "I thought it was going to go away but it's just gotten worse and now I can't eat or sleep because of the pain."  Patient is obviously uncomfortable during triage.  Patient is alert and oriented x 4.  Skin is warm and dry.

## 2016-05-10 NOTE — ED Notes (Signed)
NAD noted at time of D/C. Pt taken tot he lobby via wheelchair by Theodoro Gristave, EDT-P. Pt denies any comments/concerns at this time.

## 2016-05-10 NOTE — ED Notes (Signed)
See triage note  Right ear pain for about 1 week   Noticed some drainage from ear over the past 1-2 days  Large   Possible abscess area noted near ear  Very tearful

## 2016-05-10 NOTE — ED Notes (Signed)
Back from ct scan  Up to bathroom with assistance..Marland Kitchen

## 2016-05-10 NOTE — ED Notes (Signed)
States pain is returning.

## 2016-06-12 ENCOUNTER — Other Ambulatory Visit: Payer: Self-pay | Admitting: Student

## 2016-06-12 DIAGNOSIS — K7400 Hepatic fibrosis, unspecified: Secondary | ICD-10-CM

## 2016-06-12 DIAGNOSIS — B182 Chronic viral hepatitis C: Secondary | ICD-10-CM

## 2016-06-12 DIAGNOSIS — R1310 Dysphagia, unspecified: Secondary | ICD-10-CM

## 2016-06-18 ENCOUNTER — Ambulatory Visit
Admission: RE | Admit: 2016-06-18 | Discharge: 2016-06-18 | Disposition: A | Discharge status: Home / Self Care | Payer: Medicaid Other | Source: Ambulatory Visit | Attending: Student | Admitting: Student

## 2016-06-18 ENCOUNTER — Ambulatory Visit: Payer: Medicaid Other

## 2016-06-18 DIAGNOSIS — K824 Cholesterolosis of gallbladder: Secondary | ICD-10-CM | POA: Insufficient documentation

## 2016-06-18 DIAGNOSIS — K7400 Hepatic fibrosis, unspecified: Secondary | ICD-10-CM

## 2016-06-18 DIAGNOSIS — R1310 Dysphagia, unspecified: Secondary | ICD-10-CM | POA: Insufficient documentation

## 2016-06-18 DIAGNOSIS — B182 Chronic viral hepatitis C: Secondary | ICD-10-CM

## 2016-06-18 DIAGNOSIS — N281 Cyst of kidney, acquired: Secondary | ICD-10-CM | POA: Diagnosis not present

## 2016-06-18 DIAGNOSIS — K219 Gastro-esophageal reflux disease without esophagitis: Secondary | ICD-10-CM | POA: Diagnosis not present

## 2016-09-02 ENCOUNTER — Encounter: Payer: Self-pay | Admitting: *Deleted

## 2016-09-03 ENCOUNTER — Ambulatory Visit: Payer: Medicaid Other | Admitting: Anesthesiology

## 2016-09-03 ENCOUNTER — Encounter
Admission: RE | Disposition: A | Discharge status: Home / Self Care | Payer: Self-pay | Source: Ambulatory Visit | Attending: Unknown Physician Specialty

## 2016-09-03 ENCOUNTER — Encounter: Payer: Self-pay | Admitting: *Deleted

## 2016-09-03 ENCOUNTER — Ambulatory Visit
Admission: RE | Admit: 2016-09-03 | Discharge: 2016-09-03 | Disposition: A | Discharge status: Home / Self Care | Payer: Medicaid Other | Source: Ambulatory Visit | Attending: Unknown Physician Specialty | Admitting: Unknown Physician Specialty

## 2016-09-03 DIAGNOSIS — M1990 Unspecified osteoarthritis, unspecified site: Secondary | ICD-10-CM | POA: Insufficient documentation

## 2016-09-03 DIAGNOSIS — Z7982 Long term (current) use of aspirin: Secondary | ICD-10-CM | POA: Diagnosis not present

## 2016-09-03 DIAGNOSIS — I10 Essential (primary) hypertension: Secondary | ICD-10-CM | POA: Diagnosis not present

## 2016-09-03 DIAGNOSIS — Z7951 Long term (current) use of inhaled steroids: Secondary | ICD-10-CM | POA: Diagnosis not present

## 2016-09-03 DIAGNOSIS — K2960 Other gastritis without bleeding: Secondary | ICD-10-CM | POA: Diagnosis not present

## 2016-09-03 DIAGNOSIS — F17210 Nicotine dependence, cigarettes, uncomplicated: Secondary | ICD-10-CM | POA: Diagnosis not present

## 2016-09-03 DIAGNOSIS — F329 Major depressive disorder, single episode, unspecified: Secondary | ICD-10-CM | POA: Diagnosis not present

## 2016-09-03 DIAGNOSIS — Z79891 Long term (current) use of opiate analgesic: Secondary | ICD-10-CM | POA: Diagnosis not present

## 2016-09-03 DIAGNOSIS — R7303 Prediabetes: Secondary | ICD-10-CM | POA: Diagnosis not present

## 2016-09-03 DIAGNOSIS — K253 Acute gastric ulcer without hemorrhage or perforation: Secondary | ICD-10-CM | POA: Insufficient documentation

## 2016-09-03 DIAGNOSIS — E7800 Pure hypercholesterolemia, unspecified: Secondary | ICD-10-CM | POA: Diagnosis not present

## 2016-09-03 DIAGNOSIS — F419 Anxiety disorder, unspecified: Secondary | ICD-10-CM | POA: Insufficient documentation

## 2016-09-03 DIAGNOSIS — Z79899 Other long term (current) drug therapy: Secondary | ICD-10-CM | POA: Insufficient documentation

## 2016-09-03 HISTORY — PX: ESOPHAGOGASTRODUODENOSCOPY (EGD) WITH PROPOFOL: SHX5813

## 2016-09-03 SURGERY — ESOPHAGOGASTRODUODENOSCOPY (EGD) WITH PROPOFOL
Anesthesia: General

## 2016-09-03 MED ORDER — MIDAZOLAM HCL 2 MG/2ML IJ SOLN
INTRAMUSCULAR | Status: AC
  Filled 2016-09-03: qty 2

## 2016-09-03 MED ORDER — FENTANYL CITRATE (PF) 100 MCG/2ML IJ SOLN
INTRAMUSCULAR | Status: AC
  Filled 2016-09-03: qty 2

## 2016-09-03 MED ORDER — SODIUM CHLORIDE 0.9 % IV SOLN
INTRAVENOUS | Status: DC
  Administered 2016-09-03: 14:00:00 via INTRAVENOUS

## 2016-09-03 MED ORDER — SODIUM CHLORIDE 0.9 % IV SOLN: INTRAVENOUS | Status: DC

## 2016-09-03 MED ORDER — MIDAZOLAM HCL 2 MG/2ML IJ SOLN
INTRAMUSCULAR | Status: DC | PRN
  Administered 2016-09-03: 2 mg via INTRAVENOUS

## 2016-09-03 MED ORDER — PROPOFOL 500 MG/50ML IV EMUL
INTRAVENOUS | Status: DC | PRN
  Administered 2016-09-03: 120 ug/kg/min via INTRAVENOUS

## 2016-09-03 MED ORDER — FENTANYL CITRATE (PF) 100 MCG/2ML IJ SOLN
INTRAMUSCULAR | Status: DC | PRN
  Administered 2016-09-03: 100 ug via INTRAVENOUS

## 2016-09-03 MED ORDER — PROPOFOL 10 MG/ML IV BOLUS
INTRAVENOUS | Status: AC
  Filled 2016-09-03: qty 20

## 2016-09-03 MED ORDER — LIDOCAINE HCL (PF) 2 % IJ SOLN
INTRAMUSCULAR | Status: AC
  Filled 2016-09-03: qty 2

## 2016-09-03 MED ORDER — PROPOFOL 10 MG/ML IV BOLUS
INTRAVENOUS | Status: DC | PRN
  Administered 2016-09-03: 30 mg via INTRAVENOUS
  Administered 2016-09-03: 20 mg via INTRAVENOUS

## 2016-09-03 NOTE — H&P (Signed)
Primary Care Physician:  Veneda MelterWHITE, CHRISTINA M, FNP Primary Gastroenterologist:  Dr. Mechele CollinElliott  Pre-Procedure History & Physical: HPI:  Donna ComeLillian Marie Phelps is a 56 y.o. female is here for an endoscopy.   Past Medical History:  Diagnosis Date  . Anxiety   . Arthritis   . Chronic hepatitis (HCC)   . Constipation   . Depression   . High cholesterol   . Hypertension   . Prediabetes     Past Surgical History:  Procedure Laterality Date  . ESOPHAGOGASTRODUODENOSCOPY (EGD) WITH PROPOFOL N/A 03/24/2016   Procedure: ESOPHAGOGASTRODUODENOSCOPY (EGD) WITH PROPOFOL;  Surgeon: Scot Junobert T Elliott, MD;  [facility] END[facility]COPY;  Service: Endoscopy;  Laterality: N/A;    Prior to Admission medications   Medication Sig Start Date End Date Taking? Authorizing Provider  albuterol (PROVENTIL HFA;VENTOLIN HFA) 108 (90 Base) MCG/ACT inhaler Inhale into the lungs daily.   Yes Historical Provider, MD  aspirin EC 81 MG tablet Take 81 mg by mouth daily.   Yes Historical Provider, MD  beclomethasone (QVAR) 40 MCG/ACT inhaler Inhale into the lungs 2 (two) times daily.   Yes Historical Provider, MD  lisinopril-hydrochlorothiazide (PRINZIDE,ZESTORETIC) 20-12.5 MG tablet Take 1 tablet by mouth daily.   Yes Historical Provider, MD  naproxen (NAPROSYN) 500 MG tablet Take 500 mg by mouth 2 (two) times daily with a meal.   Yes Historical Provider, MD  oxyCODONE-acetaminophen (ROXICET) 5-325 MG tablet Take 1 tablet by mouth every 6 (six) hours as needed. 05/10/16  Yes Minna AntisKevin Paduchowski, MD  fluconazole (DIFLUCAN) 200 MG tablet Take 200 mg by mouth daily.    Historical Provider, MD  Ledipasvir-Sofosbuvir (HARVONI) 90-400 MG TABS Take by mouth.    Historical Provider, MD    Allergies as of 08/11/2016  . (No Known Allergies)    History reviewed. No pertinent family history.  Social History   Social History  . Marital status: Legally Separated    Spouse name: N/A  . Number of children: N/A  . Years of  education: N/A   Occupational History  . Not on file.   Social History Main Topics  . Smoking status: Current Every Day Smoker    Packs/day: 0.25    Years: 15.00    Types: Cigarettes  . Smokeless tobacco: Never Used  . Alcohol use No  . Drug use: No  . Sexual activity: Not on file   Other Topics Concern  . Not on file   Social History Narrative  . No narrative on file    Review of Systems: See HPI, otherwise negative ROS  Physical Exam: BP 107/66   Pulse (!) 55   Temp (!) 96.5 F (35.8 C) (Tympanic)   Resp 20   Ht 5\' 6"  (1.676 m)   Wt 78 kg (172 lb)   SpO2 96%   BMI 27.76 kg/m  General:   Alert,  pleasant and cooperative in NAD Head:  Normocephalic and atraumatic. Neck:  Supple; no masses or thyromegaly. Lungs:  Clear throughout to auscultation.    Heart:  Regular rate and rhythm. Abdomen:  Soft, nontender and nondistended. Normal bowel sounds, without guarding, and without rebound.   Neurologic:  Alert and  oriented x4;  grossly normal neurologically.  Impression/Plan: Donna ComeLillian Marie Phelps is here for an endoscopy to be performed for follow up gastric ulcers and gastritis  Risks, benefits, limitations, and alternatives regarding  endoscopy have been reviewed with the patient.  Questions have been answered.  All parties agreeable.   Lynnae PrudeELLIOTT, ROBERT, MD  09/03/2016, 2:59 PM

## 2016-09-03 NOTE — Anesthesia Post-op Follow-up Note (Signed)
Anesthesia QCDR form completed.        

## 2016-09-03 NOTE — Op Note (Signed)
[facility] Gastroenterology Patient Name: Donna Phelps Procedure Date: 09/03/2016 2:53 PM MRN: [id number] Account #: [id number] Date of Birth: 11-15-1960 Admit Type: Outpatient Age: [age] Room: [facility] ENDO ROOM 4 Gender: Female Note Status: Finalized Procedure:            Upper GI endoscopy Indications:          Follow-up of acute gastric ulcer Providers:            Scot Junobert T. Elliott, MD Referring MD:         Dustin Folkslinic Scott, MD (Referring MD) Medicines:            Propofol per Anesthesia Complications:        No immediate complications. Procedure:            Pre-Anesthesia Assessment:                       - After reviewing the risks and benefits, the patient                        was deemed in satisfactory condition to undergo the                        procedure.                       After obtaining informed consent, the endoscope was                        passed under direct vision. Throughout the procedure,                        the patient's blood pressure, pulse, and oxygen                        saturations were monitored continuously. The Endoscope                        was introduced through the mouth, and advanced to the                        second part of duodenum. The upper GI endoscopy was                        accomplished without difficulty. The patient tolerated                        the procedure well. Findings:      The examined esophagus was normal.      Two non-bleeding superficial gastric ulcers with no stigmata of bleeding       were found in the gastric antrum. The largest lesion was 3 mm in largest       dimension. Biopsies were taken with a cold forceps for histology.       Biopsies were taken with a cold forceps for Helicobacter pylori testing.      Prominent gastric folds were found in the gastric antrum. Biopsies were       taken with a cold forceps for histology. Biopsies were taken with a cold       forceps for Helicobacter  pylori testing.      Diffuse mild inflammation characterized by erythema and  granularity was       found in the gastric body. Biopsies were taken with a cold forceps for       histology.      The examined duodenum was normal. Impression:           - Normal esophagus.                       - Non-bleeding gastric ulcers with no stigmata of                        bleeding. Biopsied.                       - Enlarged gastric folds. Biopsied.                       - Gastritis. Biopsied.                       - Normal examined duodenum. Recommendation:       - Await pathology results. Scot Jun, MD 09/03/2016 3:22:02 PM This report has been signed electronically. Number of Addenda: 0 Note Initiated On: 09/03/2016 2:53 PM      [facility]

## 2016-09-03 NOTE — Transfer of Care (Signed)
Immediate Anesthesia Transfer of Care Note  Patient: Donna Phelps  Procedure(s) Performed: Procedure(s): ESOPHAGOGASTRODUODENOSCOPY (EGD) WITH PROPOFOL (N/A)  Patient Location: PACU  Anesthesia Type:General  Level of Consciousness: awake and alert   Airway & Oxygen Therapy: Patient Spontanous Breathing and Patient connected to nasal cannula oxygen  Post-op Assessment: Report given to RN and Post -op Vital signs reviewed and stable  Post vital signs: Reviewed  Last Vitals:  Vitals:   09/03/16 1352  BP: 107/66  Pulse: (!) 55  Resp: 20  Temp: (!) 35.8 C    Last Pain:  Vitals:   09/03/16 1352  TempSrc: Tympanic         Complications: No apparent anesthesia complications

## 2016-09-03 NOTE — Anesthesia Preprocedure Evaluation (Signed)
Anesthesia Evaluation  Patient identified by MRN, date of birth, ID band Patient awake    Reviewed: Allergy & Precautions, NPO status , Patient's Chart, lab work & pertinent test results  History of Anesthesia Complications Negative for: history of anesthetic complications  Airway Mallampati: II  TM Distance: >3 FB Neck ROM: Full    Dental  (+) Poor Dentition, Missing   Pulmonary asthma , neg sleep apnea, neg COPD, Current Smoker,    breath sounds clear to auscultation- rhonchi (-) wheezing      Cardiovascular hypertension, Pt. on medications (-) CAD and (-) Past MI  Rhythm:Regular Rate:Normal - Systolic murmurs and - Diastolic murmurs    Neuro/Psych PSYCHIATRIC DISORDERS Anxiety Depression negative neurological ROS     GI/Hepatic negative GI ROS, (+) Hepatitis -, C  Endo/Other  negative endocrine ROSneg diabetes  Renal/GU negative Renal ROS     Musculoskeletal  (+) Arthritis ,   Abdominal (+) - obese,   Peds  Hematology negative hematology ROS (+)   Anesthesia Other Findings Past Medical History: No date: Anxiety No date: Arthritis No date: Chronic hepatitis (HCC) No date: Constipation No date: Depression No date: High cholesterol No date: Hypertension No date: Prediabetes   Reproductive/Obstetrics                             Anesthesia Physical Anesthesia Plan  ASA: III  Anesthesia Plan: General   Post-op Pain Management:    Induction: Intravenous  Airway Management Planned: Natural Airway  Additional Equipment:   Intra-op Plan:   Post-operative Plan:   Informed Consent: I have reviewed the patients History and Physical, chart, labs and discussed the procedure including the risks, benefits and alternatives for the proposed anesthesia with the patient or authorized representative who has indicated his/her understanding and acceptance.   Dental advisory given  Plan  Discussed with: CRNA and Anesthesiologist  Anesthesia Plan Comments:         Anesthesia Quick Evaluation

## 2016-09-04 ENCOUNTER — Encounter: Payer: Self-pay | Admitting: Unknown Physician Specialty

## 2016-09-05 LAB — SURGICAL PATHOLOGY

## 2016-11-10 ENCOUNTER — Encounter (INDEPENDENT_AMBULATORY_CARE_PROVIDER_SITE_OTHER): Payer: Self-pay | Admitting: Podiatry

## 2016-11-10 NOTE — Progress Notes (Signed)
This encounter was created in error - please disregard.

## 2017-09-07 ENCOUNTER — Encounter: Admission: RE | Payer: Self-pay | Source: Ambulatory Visit

## 2017-09-07 SURGERY — COLONOSCOPY WITH PROPOFOL
Anesthesia: General

## 2017-11-18 ENCOUNTER — Encounter: Admission: RE | Payer: Self-pay | Source: Ambulatory Visit

## 2017-11-18 ENCOUNTER — Ambulatory Visit
Admission: RE | Admit: 2017-11-18 | Payer: Medicaid Other | Source: Ambulatory Visit | Admitting: Unknown Physician Specialty

## 2017-11-18 SURGERY — ESOPHAGOGASTRODUODENOSCOPY (EGD) WITH PROPOFOL
Anesthesia: General

## 2017-12-15 ENCOUNTER — Encounter: Payer: Self-pay | Admitting: *Deleted

## 2017-12-31 ENCOUNTER — Ambulatory Visit: Payer: Medicaid Other | Admitting: Anesthesiology

## 2017-12-31 ENCOUNTER — Encounter
Admission: RE | Disposition: A | Discharge status: Home / Self Care | Payer: Self-pay | Source: Ambulatory Visit | Attending: Ophthalmology

## 2017-12-31 ENCOUNTER — Ambulatory Visit
Admission: RE | Admit: 2017-12-31 | Discharge: 2017-12-31 | Disposition: A | Discharge status: Home / Self Care | Payer: Medicaid Other | Source: Ambulatory Visit | Attending: Ophthalmology | Admitting: Ophthalmology

## 2017-12-31 DIAGNOSIS — Z79899 Other long term (current) drug therapy: Secondary | ICD-10-CM | POA: Diagnosis not present

## 2017-12-31 DIAGNOSIS — I10 Essential (primary) hypertension: Secondary | ICD-10-CM | POA: Insufficient documentation

## 2017-12-31 DIAGNOSIS — H2511 Age-related nuclear cataract, right eye: Secondary | ICD-10-CM | POA: Insufficient documentation

## 2017-12-31 HISTORY — PX: CATARACT EXTRACTION W/PHACO: SHX586

## 2017-12-31 HISTORY — DX: Unspecified cirrhosis of liver: K74.60

## 2017-12-31 SURGERY — PHACOEMULSIFICATION, CATARACT, WITH IOL INSERTION
Anesthesia: Monitor Anesthesia Care | Laterality: Right

## 2017-12-31 MED ORDER — MOXIFLOXACIN HCL 0.5 % OP SOLN
OPHTHALMIC | Status: AC
  Filled 2017-12-31: qty 3

## 2017-12-31 MED ORDER — GLYCOPYRROLATE 0.2 MG/ML IJ SOLN
INTRAMUSCULAR | Status: AC
  Filled 2017-12-31: qty 1

## 2017-12-31 MED ORDER — LIDOCAINE HCL (PF) 4 % IJ SOLN
INTRAMUSCULAR | Status: DC | PRN
  Administered 2017-12-31: .1 mL via OPHTHALMIC

## 2017-12-31 MED ORDER — SODIUM HYALURONATE 23 MG/ML IO SOLN
INTRAOCULAR | Status: DC | PRN
  Administered 2017-12-31: 0.6 mL via INTRAOCULAR

## 2017-12-31 MED ORDER — LIDOCAINE HCL (PF) 4 % IJ SOLN
INTRAMUSCULAR | Status: AC
  Filled 2017-12-31: qty 5

## 2017-12-31 MED ORDER — ARMC OPHTHALMIC DILATING DROPS
OPHTHALMIC | Status: AC
  Administered 2017-12-31: 1 via OPHTHALMIC
  Filled 2017-12-31: qty 0.4

## 2017-12-31 MED ORDER — POVIDONE-IODINE 5 % OP SOLN
OPHTHALMIC | Status: DC | PRN
  Administered 2017-12-31: 1

## 2017-12-31 MED ORDER — FENTANYL CITRATE (PF) 100 MCG/2ML IJ SOLN
INTRAMUSCULAR | Status: AC
  Filled 2017-12-31: qty 2

## 2017-12-31 MED ORDER — BSS IO SOLN
INTRAOCULAR | Status: DC | PRN
  Administered 2017-12-31: 1 via INTRAOCULAR

## 2017-12-31 MED ORDER — POVIDONE-IODINE 5 % OP SOLN
OPHTHALMIC | Status: AC
  Filled 2017-12-31: qty 30

## 2017-12-31 MED ORDER — SODIUM HYALURONATE 23 MG/ML IO SOLN
INTRAOCULAR | Status: AC
  Filled 2017-12-31: qty 0.6

## 2017-12-31 MED ORDER — GLYCOPYRROLATE 0.2 MG/ML IJ SOLN
INTRAMUSCULAR | Status: DC | PRN
  Administered 2017-12-31: 0.2 mg via INTRAVENOUS

## 2017-12-31 MED ORDER — SODIUM CHLORIDE 0.9 % IV SOLN
INTRAVENOUS | Status: DC
  Administered 2017-12-31: 12:00:00 via INTRAVENOUS

## 2017-12-31 MED ORDER — EPINEPHRINE PF 1 MG/ML IJ SOLN
INTRAMUSCULAR | Status: AC
  Filled 2017-12-31: qty 1

## 2017-12-31 MED ORDER — MOXIFLOXACIN HCL 0.5 % OP SOLN: 1.0000 [drp] | OPHTHALMIC | Status: DC | PRN

## 2017-12-31 MED ORDER — ARMC OPHTHALMIC DILATING DROPS
1.0000 "application " | OPHTHALMIC | Status: AC
  Administered 2017-12-31 (×3): 1 via OPHTHALMIC

## 2017-12-31 MED ORDER — MIDAZOLAM HCL 2 MG/2ML IJ SOLN
INTRAMUSCULAR | Status: AC
  Filled 2017-12-31: qty 2

## 2017-12-31 MED ORDER — MIDAZOLAM HCL 2 MG/2ML IJ SOLN
INTRAMUSCULAR | Status: DC | PRN
  Administered 2017-12-31: 2 mg via INTRAVENOUS

## 2017-12-31 MED ORDER — MOXIFLOXACIN HCL 0.5 % OP SOLN
OPHTHALMIC | Status: DC | PRN
  Administered 2017-12-31: 0.2 mL via OPHTHALMIC

## 2017-12-31 MED ORDER — FENTANYL CITRATE (PF) 100 MCG/2ML IJ SOLN
INTRAMUSCULAR | Status: DC | PRN
  Administered 2017-12-31 (×2): 50 ug via INTRAVENOUS

## 2017-12-31 MED ORDER — SODIUM HYALURONATE 10 MG/ML IO SOLN
INTRAOCULAR | Status: DC | PRN
  Administered 2017-12-31: 0.55 mL via INTRAOCULAR

## 2017-12-31 SURGICAL SUPPLY — 18 items
DISSECTOR HYDRO NUCLEUS 50X22 (MISCELLANEOUS) ×2 IMPLANT
GLOVE BIO SURGEON STRL SZ8 (GLOVE) ×2 IMPLANT
GLOVE BIOGEL M 6.5 STRL (GLOVE) ×2 IMPLANT
GLOVE SURG LX 7.5 STRW (GLOVE) ×1
GLOVE SURG LX STRL 7.5 STRW (GLOVE) ×1 IMPLANT
GOWN STRL REUS W/ TWL LRG LVL3 (GOWN DISPOSABLE) ×2 IMPLANT
GOWN STRL REUS W/TWL LRG LVL3 (GOWN DISPOSABLE) ×2
LABEL CATARACT MEDS ST (LABEL) ×2 IMPLANT
LENS IOL TECNIS ITEC 18.0 (Intraocular Lens) ×2 IMPLANT
PACK CATARACT (MISCELLANEOUS) ×2 IMPLANT
PACK CATARACT KING (MISCELLANEOUS) ×2 IMPLANT
PACK EYE AFTER SURG (MISCELLANEOUS) ×2 IMPLANT
SOL BAL SALT 15ML (MISCELLANEOUS) ×2
SOL BSS BAG (MISCELLANEOUS) ×2
SOLUTION BAL SALT 15ML (MISCELLANEOUS) ×1 IMPLANT
SOLUTION BSS BAG (MISCELLANEOUS) ×1 IMPLANT
WATER STERILE IRR 250ML POUR (IV SOLUTION) ×2 IMPLANT
WIPE NON LINTING 3.25X3.25 (MISCELLANEOUS) ×2 IMPLANT

## 2017-12-31 NOTE — Discharge Instructions (Signed)
Eye Surgery Discharge Instructions  Expect mild scratchy sensation or mild soreness. DO NOT RUB YOUR EYE!  The day of surgery:  Minimal physical activity, but bed rest is not required  No reading, computer work, or close hand work  No bending, lifting, or straining.  May watch TV  For 24 hours:  No driving, legal decisions, or alcoholic beverages  Safety precautions  Eat anything you prefer: It is better to start with liquids, then soup then solid foods.  _____ Eye patch should be worn until postoperative exam tomorrow.  ____ Solar shield eyeglasses should be worn for comfort in the sunlight/patch while sleeping  Resume all regular medications including aspirin or Coumadin if these were discontinued prior to surgery. You may shower, bathe, shave, or wash your hair. Tylenol may be taken for mild discomfort.  Call your doctor if you experience significant pain, nausea, or vomiting, fever > 101 or other signs of infection. [phone] or [phone] Specific instructions:  Follow-up Information    [place] CraneKing, Bradley Mark, MD Follow up.   Specialty:  Ophthalmology Why:  01-01-18 at 10:15 Contact information: [address] [place] [address] [phone]          Eye Surgery Discharge Instructions  Expect mild scratchy sensation or mild soreness. DO NOT RUB YOUR EYE!  The day of surgery:  Minimal physical activity, but bed rest is not required  No reading, computer work, or close hand work  No bending, lifting, or straining.  May watch TV  For 24 hours:  No driving, legal decisions, or alcoholic beverages  Safety precautions  Eat anything you prefer: It is better to start with liquids, then soup then solid foods.  _____ Eye patch should be worn until postoperative exam tomorrow.  ____ Solar shield eyeglasses should be worn for comfort in the sunlight/patch while sleeping  Resume all regular medications including aspirin or Coumadin if  these were discontinued prior to surgery. You may shower, bathe, shave, or wash your hair. Tylenol may be taken for mild discomfort.  Call your doctor if you experience significant pain, nausea, or vomiting, fever > 101 or other signs of infection. [phone] or [phone] Specific instructions:  Follow-up Information    [place] CraneKing, Bradley Mark, MD Follow up.   Specialty:  Ophthalmology Why:  01-01-18 at 10:15 Contact information: [address] [place] [address] [phone]

## 2017-12-31 NOTE — Op Note (Signed)
OPERATIVE NOTE  Donna Phelps [id number] 12/31/2017   PREOPERATIVE DIAGNOSIS:  Nuclear sclerotic cataract right eye.  H25.11   POSTOPERATIVE DIAGNOSIS:    Nuclear sclerotic cataract right eye.     PROCEDURE:  Phacoemusification with posterior chamber intraocular lens placement of the right eye   LENS:   Implant Name Type Inv. Item Serial No. Manufacturer Lot No. LRB No. Used  LENS IOL DIOP 18.0 - [id number] Intraocular Lens LENS IOL DIOP 18.0 [id number] AMO  Right 1       PCB00 +18.0   ULTRASOUND TIME: 0 minutes 16 seconds.  CDE 1.06   SURGEON:  Willey BladeBradley King, MD, MPH  ANESTHESIOLOGIST: Anesthesiologist: Naomie DeanKephart, William K, MD CRNA: Omer JackWeatherly, Janice, CRNA   ANESTHESIA:  Topical with tetracaine drops augmented with 1% preservative-free intracameral lidocaine.  ESTIMATED BLOOD LOSS: less than 1 mL.   COMPLICATIONS:  None.   DESCRIPTION OF PROCEDURE:  The patient was identified in the holding room and transported to the operating room and placed in the supine position under the operating microscope.  The right eye was identified as the operative eye and it was prepped and draped in the usual sterile ophthalmic fashion.   A 1.0 millimeter clear-corneal paracentesis was made at the 10:30 position. 0.5 ml of preservative-free 1% lidocaine with epinephrine was injected into the anterior chamber.  The anterior chamber was filled with Healon 5 viscoelastic.  A 2.4 millimeter keratome was used to make a near-clear corneal incision at the 8:00 position.  A curvilinear capsulorrhexis was made with a cystotome and capsulorrhexis forceps.  Balanced salt solution was used to hydrodissect and hydrodelineate the nucleus.   Phacoemulsification was then used in stop and chop fashion to remove the lens nucleus and epinucleus.  The remaining cortex was then removed using the irrigation and aspiration handpiece. Healon was then placed into the capsular bag to distend it for lens placement.  A  lens was then injected into the capsular bag.  The remaining viscoelastic was aspirated.   Wounds were hydrated with balanced salt solution.  The anterior chamber was inflated to a physiologic pressure with balanced salt solution.   Intracameral vigamox 0.1 mL undiluted was injected into the eye and a drop placed onto the ocular surface.  No wound leaks were noted.  The patient was taken to the recovery room in stable condition without complications of anesthesia or surgery  Willey BladeBradley King 12/31/2017, 1:02 PM

## 2017-12-31 NOTE — H&P (Signed)
The History and Physical notes are on paper, have been signed, and are to be scanned.   I have examined the patient and there are no changes to the H&P.   Donna BladeBradley Phelps 12/31/2017 12:27 PM

## 2017-12-31 NOTE — Anesthesia Preprocedure Evaluation (Addendum)
Anesthesia Evaluation  Patient identified by MRN, date of birth, ID band Patient awake    Reviewed: Allergy & Precautions, NPO status , Patient's Chart, lab work & pertinent test results  History of Anesthesia Complications Negative for: history of anesthetic complications  Airway Mallampati: III       Dental  (+) Poor Dentition, Chipped, Loose, Missing   Pulmonary neg sleep apnea, neg COPD, Current Smoker,           Cardiovascular hypertension, Pt. on medications (-) Past MI and (-) CHF (-) dysrhythmias (-) Valvular Problems/Murmurs     Neuro/Psych neg Seizures Anxiety Depression    GI/Hepatic neg GERD  ,(+) Hepatitis -, C  Endo/Other  neg diabetes  Renal/GU negative Renal ROS     Musculoskeletal   Abdominal   Peds  Hematology   Anesthesia Other Findings   Reproductive/Obstetrics                            Anesthesia Physical Anesthesia Plan  ASA: III  Anesthesia Plan: MAC   Post-op Pain Management:    Induction: Intravenous  PONV Risk Score and Plan:   Airway Management Planned: Nasal Cannula  Additional Equipment:   Intra-op Plan:   Post-operative Plan:   Informed Consent: I have reviewed the patients History and Physical, chart, labs and discussed the procedure including the risks, benefits and alternatives for the proposed anesthesia with the patient or authorized representative who has indicated his/her understanding and acceptance.     Plan Discussed with:   Anesthesia Plan Comments:         Anesthesia Quick Evaluation

## 2017-12-31 NOTE — Transfer of Care (Signed)
Immediate Anesthesia Transfer of Care Note  Patient: Dixie Coppa  Procedure(s) Performed: CATARACT EXTRACTION PHACO AND INTRAOCULAR LENS PLACEMENT (IOC) (Right )  Patient Location: Short Stay  Anesthesia Type:MAC  Level of Consciousness: awake, alert  and oriented  Airway & Oxygen Therapy: Patient Spontanous Breathing  Post-op Assessment: Report given to RN and Post -op Vital signs reviewed and stable  Post vital signs: Reviewed  Last Vitals:  Vitals Value Taken Time  BP 120/73 12/31/2017  1:06 PM  Temp    Pulse 60 12/31/2017  1:06 PM  Resp 16 12/31/2017  1:06 PM  SpO2 100 % 12/31/2017  1:06 PM    Last Pain:  Vitals:   12/31/17 1120  TempSrc: Tympanic  PainSc: 0-No pain         Complications: No apparent anesthesia complications

## 2017-12-31 NOTE — Anesthesia Postprocedure Evaluation (Signed)
Anesthesia Post Note  Patient: Donna Phelps  Procedure(s) Performed: CATARACT EXTRACTION PHACO AND INTRAOCULAR LENS PLACEMENT (IOC) (Right )  Patient location during evaluation: Short Stay Anesthesia Type: MAC Level of consciousness: awake and alert and oriented Pain management: pain level controlled Vital Signs Assessment: post-procedure vital signs reviewed and stable Respiratory status: spontaneous breathing Cardiovascular status: stable Postop Assessment: no headache Anesthetic complications: no     Last Vitals:  Vitals:   12/31/17 1306 12/31/17 1315  BP: 120/73 (!) 133/95  Pulse: 60 66  Resp: 16   Temp:    SpO2: 100% 93%    Last Pain:  Vitals:   12/31/17 1315  TempSrc:   PainSc: 0-No pain                 Lanora Manis

## 2017-12-31 NOTE — Anesthesia Post-op Follow-up Note (Signed)
Anesthesia QCDR form completed.        

## 2018-01-01 ENCOUNTER — Encounter: Payer: Self-pay | Admitting: Ophthalmology

## 2018-08-13 ENCOUNTER — Other Ambulatory Visit: Payer: Self-pay | Admitting: Family Medicine

## 2018-08-13 DIAGNOSIS — Z1231 Encounter for screening mammogram for malignant neoplasm of breast: Secondary | ICD-10-CM

## 2018-08-20 ENCOUNTER — Other Ambulatory Visit: Payer: Self-pay | Admitting: Family Medicine

## 2018-08-20 ENCOUNTER — Ambulatory Visit
Admission: RE | Admit: 2018-08-20 | Discharge: 2018-08-20 | Disposition: A | Discharge status: Home / Self Care | Payer: Medicaid Other | Source: Ambulatory Visit | Attending: Family Medicine | Admitting: Family Medicine

## 2018-08-20 DIAGNOSIS — M25551 Pain in right hip: Secondary | ICD-10-CM | POA: Diagnosis present

## 2018-10-06 ENCOUNTER — Encounter: Payer: Self-pay | Admitting: Anesthesiology

## 2018-10-06 ENCOUNTER — Other Ambulatory Visit: Payer: Self-pay

## 2018-10-06 ENCOUNTER — Encounter: Payer: Self-pay | Admitting: *Deleted

## 2018-10-18 ENCOUNTER — Ambulatory Visit: Admission: RE | Admit: 2018-10-18 | Payer: Medicaid Other | Source: Home / Self Care | Admitting: Ophthalmology

## 2018-10-18 HISTORY — DX: Unspecified asthma, uncomplicated: J45.909

## 2018-10-18 HISTORY — DX: Gastro-esophageal reflux disease without esophagitis: K21.9

## 2018-10-18 SURGERY — PHACOEMULSIFICATION, CATARACT, WITH IOL INSERTION
Anesthesia: Topical | Laterality: Left

## 2018-12-03 ENCOUNTER — Other Ambulatory Visit
Admission: RE | Admit: 2018-12-03 | Discharge: 2018-12-03 | Disposition: A | Discharge status: Home / Self Care | Payer: Medicaid Other | Source: Ambulatory Visit | Attending: Ophthalmology | Admitting: Ophthalmology

## 2018-12-03 ENCOUNTER — Other Ambulatory Visit: Payer: Self-pay

## 2018-12-03 DIAGNOSIS — Z1159 Encounter for screening for other viral diseases: Secondary | ICD-10-CM | POA: Insufficient documentation

## 2018-12-04 LAB — NOVEL CORONAVIRUS, NAA (HOSP ORDER, SEND-OUT TO REF LAB; TAT 18-24 HRS): SARS-CoV-2, NAA: NOT DETECTED

## 2018-12-06 ENCOUNTER — Encounter: Payer: Self-pay | Admitting: *Deleted

## 2018-12-06 ENCOUNTER — Other Ambulatory Visit: Payer: Self-pay

## 2018-12-07 NOTE — Discharge Instructions (Signed)

## 2018-12-08 ENCOUNTER — Encounter
Admission: RE | Disposition: A | Discharge status: Home / Self Care | Payer: Self-pay | Source: Home / Self Care | Attending: Ophthalmology

## 2018-12-08 ENCOUNTER — Ambulatory Visit
Admission: RE | Admit: 2018-12-08 | Discharge: 2018-12-08 | Disposition: A | Discharge status: Home / Self Care | Payer: Medicaid Other | Attending: Ophthalmology | Admitting: Ophthalmology

## 2018-12-08 ENCOUNTER — Ambulatory Visit: Payer: Medicaid Other | Admitting: Anesthesiology

## 2018-12-08 DIAGNOSIS — Z9841 Cataract extraction status, right eye: Secondary | ICD-10-CM | POA: Insufficient documentation

## 2018-12-08 DIAGNOSIS — J45909 Unspecified asthma, uncomplicated: Secondary | ICD-10-CM | POA: Insufficient documentation

## 2018-12-08 DIAGNOSIS — I10 Essential (primary) hypertension: Secondary | ICD-10-CM | POA: Diagnosis not present

## 2018-12-08 DIAGNOSIS — K7460 Unspecified cirrhosis of liver: Secondary | ICD-10-CM | POA: Insufficient documentation

## 2018-12-08 DIAGNOSIS — Z791 Long term (current) use of non-steroidal anti-inflammatories (NSAID): Secondary | ICD-10-CM | POA: Diagnosis not present

## 2018-12-08 DIAGNOSIS — B1920 Unspecified viral hepatitis C without hepatic coma: Secondary | ICD-10-CM | POA: Insufficient documentation

## 2018-12-08 DIAGNOSIS — K219 Gastro-esophageal reflux disease without esophagitis: Secondary | ICD-10-CM | POA: Diagnosis not present

## 2018-12-08 DIAGNOSIS — F17200 Nicotine dependence, unspecified, uncomplicated: Secondary | ICD-10-CM | POA: Diagnosis not present

## 2018-12-08 DIAGNOSIS — Z79899 Other long term (current) drug therapy: Secondary | ICD-10-CM | POA: Diagnosis not present

## 2018-12-08 DIAGNOSIS — H2512 Age-related nuclear cataract, left eye: Secondary | ICD-10-CM | POA: Insufficient documentation

## 2018-12-08 HISTORY — PX: CATARACT EXTRACTION W/PHACO: SHX586

## 2018-12-08 SURGERY — PHACOEMULSIFICATION, CATARACT, WITH IOL INSERTION
Anesthesia: Monitor Anesthesia Care | Site: Eye | Laterality: Left

## 2018-12-08 MED ORDER — MIDAZOLAM HCL 2 MG/2ML IJ SOLN
INTRAMUSCULAR | Status: DC | PRN
  Administered 2018-12-08: 2 mg via INTRAVENOUS

## 2018-12-08 MED ORDER — LACTATED RINGERS IV SOLN: INTRAVENOUS | Status: DC

## 2018-12-08 MED ORDER — EPINEPHRINE PF 1 MG/ML IJ SOLN
INTRAOCULAR | Status: DC | PRN
  Administered 2018-12-08: 10:00:00 78 mL via OPHTHALMIC

## 2018-12-08 MED ORDER — MOXIFLOXACIN HCL 0.5 % OP SOLN
OPHTHALMIC | Status: DC | PRN
  Administered 2018-12-08: 0.2 mL via OPHTHALMIC

## 2018-12-08 MED ORDER — ACETAMINOPHEN 325 MG PO TABS: 325.0000 mg | ORAL_TABLET | Freq: Once | ORAL | Status: DC

## 2018-12-08 MED ORDER — LIDOCAINE HCL (PF) 2 % IJ SOLN
INTRAOCULAR | Status: DC | PRN
  Administered 2018-12-08: 10:00:00 2 mL via INTRAOCULAR

## 2018-12-08 MED ORDER — ACETAMINOPHEN 160 MG/5ML PO SOLN: 325.0000 mg | Freq: Once | ORAL | Status: DC

## 2018-12-08 MED ORDER — SODIUM HYALURONATE 23 MG/ML IO SOLN
INTRAOCULAR | Status: DC | PRN
  Administered 2018-12-08: 0.6 mL via INTRAOCULAR

## 2018-12-08 MED ORDER — TETRACAINE HCL 0.5 % OP SOLN
1.0000 [drp] | OPHTHALMIC | Status: DC | PRN
  Administered 2018-12-08 (×3): 1 [drp] via OPHTHALMIC

## 2018-12-08 MED ORDER — SODIUM HYALURONATE 10 MG/ML IO SOLN
INTRAOCULAR | Status: DC | PRN
  Administered 2018-12-08: 0.55 mL via INTRAOCULAR

## 2018-12-08 MED ORDER — ARMC OPHTHALMIC DILATING DROPS
1.0000 "application " | OPHTHALMIC | Status: DC | PRN
  Administered 2018-12-08 (×3): 1 via OPHTHALMIC

## 2018-12-08 MED ORDER — FENTANYL CITRATE (PF) 100 MCG/2ML IJ SOLN
INTRAMUSCULAR | Status: DC | PRN
  Administered 2018-12-08 (×2): 50 ug via INTRAVENOUS

## 2018-12-08 SURGICAL SUPPLY — 19 items
CANNULA ANT/CHMB 27G (MISCELLANEOUS) ×2 IMPLANT
CANNULA ANT/CHMB 27GA (MISCELLANEOUS) ×4 IMPLANT
DISSECTOR HYDRO NUCLEUS 50X22 (MISCELLANEOUS) ×2 IMPLANT
GLOVE SURG LX 7.5 STRW (GLOVE) ×1
GLOVE SURG LX STRL 7.5 STRW (GLOVE) ×1 IMPLANT
GLOVE SURG SYN 8.5  E (GLOVE) ×1
GLOVE SURG SYN 8.5 E (GLOVE) ×1 IMPLANT
GLOVE SURG SYN 8.5 PF PI (GLOVE) ×1 IMPLANT
GOWN STRL REUS W/ TWL LRG LVL3 (GOWN DISPOSABLE) ×2 IMPLANT
GOWN STRL REUS W/TWL LRG LVL3 (GOWN DISPOSABLE) ×2
LENS IOL TECNIS ITEC 18.0 (Intraocular Lens) ×1 IMPLANT
MARKER SKIN DUAL TIP RULER LAB (MISCELLANEOUS) ×2 IMPLANT
PACK DR. KING ARMS (PACKS) ×2 IMPLANT
PACK EYE AFTER SURG (MISCELLANEOUS) ×2 IMPLANT
PACK OPTHALMIC (MISCELLANEOUS) ×2 IMPLANT
SYR 3ML LL SCALE MARK (SYRINGE) ×2 IMPLANT
SYR TB 1ML LUER SLIP (SYRINGE) ×2 IMPLANT
WATER STERILE IRR 500ML POUR (IV SOLUTION) ×2 IMPLANT
WIPE NON LINTING 3.25X3.25 (MISCELLANEOUS) ×2 IMPLANT

## 2018-12-08 NOTE — Op Note (Signed)
OPERATIVE NOTE  Donna Phelps [id number] 12/08/2018   PREOPERATIVE DIAGNOSIS:  Nuclear sclerotic cataract left eye.  H25.12   POSTOPERATIVE DIAGNOSIS:    Nuclear sclerotic cataract left eye.     PROCEDURE:  Phacoemusification with posterior chamber intraocular lens placement of the left eye   LENS:   Implant Name Type Inv. Item Serial No. Manufacturer Lot No. LRB No. Used  LENS IOL DIOP 18.0 - [id number] Intraocular Lens LENS IOL DIOP 18.0 [id number] AMO  Left 1       PCB00 +18.0   ULTRASOUND TIME: 0 minutes 20 seconds.  CDE 1.94   SURGEON:  Willey Blade, MD, MPH   ANESTHESIA:  Topical with tetracaine drops augmented with 1% preservative-free intracameral lidocaine.  ESTIMATED BLOOD LOSS: <1 mL   COMPLICATIONS:  None.   DESCRIPTION OF PROCEDURE:  The patient was identified in the holding room and transported to the operating room and placed in the supine position under the operating microscope.  The left eye was identified as the operative eye and it was prepped and draped in the usual sterile ophthalmic fashion.   A 1.0 millimeter clear-corneal paracentesis was made at the 5:00 position. 0.5 ml of preservative-free 1% lidocaine with epinephrine was injected into the anterior chamber.  The anterior chamber was filled with Healon 5 viscoelastic.  A 2.4 millimeter keratome was used to make a near-clear corneal incision at the 2:00 position.  A curvilinear capsulorrhexis was made with a cystotome and capsulorrhexis forceps.  Balanced salt solution was used to hydrodissect and hydrodelineate the nucleus.   Phacoemulsification was then used in stop and chop fashion to remove the lens nucleus and epinucleus.  The remaining cortex was then removed using the irrigation and aspiration handpiece. Healon was then placed into the capsular bag to distend it for lens placement.  A lens was then injected into the capsular bag.  The remaining viscoelastic was aspirated.   Wounds were  hydrated with balanced salt solution.  The anterior chamber was inflated to a physiologic pressure with balanced salt solution.  Intracameral vigamox 0.1 mL undiltued was injected into the eye and a drop placed onto the ocular surface.  No wound leaks were noted.  The patient was taken to the recovery room in stable condition without complications of anesthesia or surgery  Willey Blade 12/08/2018, 10:16 AM

## 2018-12-08 NOTE — H&P (Signed)

## 2018-12-08 NOTE — Anesthesia Procedure Notes (Signed)
Procedure Name: MAC Date/Time: 12/08/2018 9:43 AM Performed by: Janna Arch, CRNA Pre-anesthesia Checklist: Patient identified, Emergency Drugs available, Suction available, Timeout performed and Patient being monitored Patient Re-evaluated:Patient Re-evaluated prior to induction Oxygen Delivery Method: Nasal cannula Placement Confirmation: positive ETCO2

## 2018-12-08 NOTE — Anesthesia Preprocedure Evaluation (Signed)
Anesthesia Evaluation  Patient identified by MRN, date of birth, ID band Patient awake    Reviewed: Allergy & Precautions, H&P , NPO status , Patient's Chart, lab work & pertinent test results  History of Anesthesia Complications Negative for: history of anesthetic complications  Airway Mallampati: II  TM Distance: >3 FB Neck ROM: full    Dental  (+) Poor Dentition, Chipped, Loose, Missing   Pulmonary neg sleep apnea, neg COPD, Current Smoker,    Pulmonary exam normal breath sounds clear to auscultation       Cardiovascular hypertension, Pt. on medications (-) Past MI and (-) CHF Normal cardiovascular exam(-) dysrhythmias (-) Valvular Problems/Murmurs Rhythm:regular Rate:Normal     Neuro/Psych neg Seizures Anxiety Depression    GI/Hepatic neg GERD  ,(+) Cirrhosis       , Hepatitis -, C  Endo/Other  neg diabetes  Renal/GU negative Renal ROS     Musculoskeletal   Abdominal   Peds  Hematology   Anesthesia Other Findings   Reproductive/Obstetrics                             Anesthesia Physical  Anesthesia Plan  ASA: III  Anesthesia Plan: MAC   Post-op Pain Management:    Induction: Intravenous  PONV Risk Score and Plan: 2 and Midazolam and Treatment may vary due to age or medical condition  Airway Management Planned: Nasal Cannula  Additional Equipment:   Intra-op Plan:   Post-operative Plan:   Informed Consent: I have reviewed the patients History and Physical, chart, labs and discussed the procedure including the risks, benefits and alternatives for the proposed anesthesia with the patient or authorized representative who has indicated his/her understanding and acceptance.       Plan Discussed with: CRNA  Anesthesia Plan Comments:         Anesthesia Quick Evaluation

## 2018-12-08 NOTE — Anesthesia Postprocedure Evaluation (Signed)
Anesthesia Post Note  Patient: Donna Phelps  Procedure(s) Performed: CATARACT EXTRACTION PHACO AND INTRAOCULAR LENS PLACEMENT ([place]) LEFT (Left Eye)  Patient location during evaluation: PACU Anesthesia Type: MAC Level of consciousness: awake and alert and oriented Pain management: satisfactory to patient Vital Signs Assessment: post-procedure vital signs reviewed and stable Respiratory status: spontaneous breathing, nonlabored ventilation and respiratory function stable Cardiovascular status: blood pressure returned to baseline and stable Postop Assessment: Adequate PO intake and No signs of nausea or vomiting Anesthetic complications: no    Raliegh Ip

## 2018-12-08 NOTE — Transfer of Care (Signed)
Immediate Anesthesia Transfer of Care Note  Patient: Donna Phelps  Procedure(s) Performed: CATARACT EXTRACTION PHACO AND INTRAOCULAR LENS PLACEMENT (IOC) LEFT (Left Eye)  Patient Location: PACU  Anesthesia Type: MAC  Level of Consciousness: awake, alert  and patient cooperative  Airway and Oxygen Therapy: Patient Spontanous Breathing and Patient connected to supplemental oxygen  Post-op Assessment: Post-op Vital signs reviewed, Patient's Cardiovascular Status Stable, Respiratory Function Stable, Patent Airway and No signs of Nausea or vomiting  Post-op Vital Signs: Reviewed and stable  Complications: No apparent anesthesia complications

## 2018-12-09 ENCOUNTER — Encounter: Payer: Self-pay | Admitting: Ophthalmology

## 2019-01-31 ENCOUNTER — Other Ambulatory Visit: Payer: Self-pay | Admitting: Student

## 2019-01-31 DIAGNOSIS — Z8619 Personal history of other infectious and parasitic diseases: Secondary | ICD-10-CM

## 2019-01-31 DIAGNOSIS — K7400 Hepatic fibrosis, unspecified: Secondary | ICD-10-CM

## 2019-02-07 ENCOUNTER — Other Ambulatory Visit: Payer: Self-pay | Admitting: Student

## 2019-02-07 DIAGNOSIS — Z8619 Personal history of other infectious and parasitic diseases: Secondary | ICD-10-CM

## 2019-02-07 DIAGNOSIS — K7400 Hepatic fibrosis, unspecified: Secondary | ICD-10-CM

## 2019-02-08 ENCOUNTER — Ambulatory Visit: Admission: RE | Admit: 2019-02-08 | Payer: Medicaid Other | Source: Ambulatory Visit

## 2019-02-09 ENCOUNTER — Other Ambulatory Visit: Payer: Self-pay

## 2019-02-09 ENCOUNTER — Ambulatory Visit
Admission: RE | Admit: 2019-02-09 | Discharge: 2019-02-09 | Disposition: A | Discharge status: Home / Self Care | Payer: Medicaid Other | Source: Ambulatory Visit | Attending: Student | Admitting: Student

## 2019-02-09 DIAGNOSIS — K7400 Hepatic fibrosis, unspecified: Secondary | ICD-10-CM

## 2019-02-09 DIAGNOSIS — Z8619 Personal history of other infectious and parasitic diseases: Secondary | ICD-10-CM | POA: Diagnosis present

## 2019-03-10 ENCOUNTER — Encounter: Admission: RE | Admit: 2019-03-10 | Payer: Medicaid Other | Source: Ambulatory Visit

## 2019-03-14 ENCOUNTER — Ambulatory Visit: Admit: 2019-03-14 | Payer: Medicaid Other | Admitting: Unknown Physician Specialty

## 2019-03-14 SURGERY — ESOPHAGOGASTRODUODENOSCOPY (EGD) WITH PROPOFOL
Anesthesia: General

## 2019-04-01 ENCOUNTER — Other Ambulatory Visit
Admission: RE | Admit: 2019-04-01 | Discharge: 2019-04-01 | Disposition: A | Discharge status: Home / Self Care | Payer: Medicaid Other | Source: Ambulatory Visit | Attending: Internal Medicine | Admitting: Internal Medicine

## 2019-04-01 ENCOUNTER — Other Ambulatory Visit: Payer: Self-pay

## 2019-04-01 DIAGNOSIS — K5900 Constipation, unspecified: Secondary | ICD-10-CM | POA: Insufficient documentation

## 2019-04-01 DIAGNOSIS — Z86010 Personal history of colonic polyps: Secondary | ICD-10-CM | POA: Insufficient documentation

## 2019-04-01 DIAGNOSIS — Z20828 Contact with and (suspected) exposure to other viral communicable diseases: Secondary | ICD-10-CM | POA: Diagnosis not present

## 2019-04-01 DIAGNOSIS — R195 Other fecal abnormalities: Secondary | ICD-10-CM | POA: Diagnosis not present

## 2019-04-01 DIAGNOSIS — R1310 Dysphagia, unspecified: Secondary | ICD-10-CM | POA: Diagnosis not present

## 2019-04-01 DIAGNOSIS — Z01812 Encounter for preprocedural laboratory examination: Secondary | ICD-10-CM | POA: Diagnosis present

## 2019-04-01 DIAGNOSIS — K2960 Other gastritis without bleeding: Secondary | ICD-10-CM | POA: Diagnosis not present

## 2019-04-02 LAB — SARS CORONAVIRUS 2 (TAT 6-24 HRS): SARS Coronavirus 2: NEGATIVE

## 2019-04-05 ENCOUNTER — Encounter: Payer: Self-pay | Admitting: *Deleted

## 2019-04-06 ENCOUNTER — Ambulatory Visit: Payer: Medicaid Other | Admitting: Anesthesiology

## 2019-04-06 ENCOUNTER — Encounter
Admission: RE | Disposition: A | Discharge status: Home / Self Care | Payer: Self-pay | Source: Home / Self Care | Attending: Internal Medicine

## 2019-04-06 ENCOUNTER — Ambulatory Visit
Admission: RE | Admit: 2019-04-06 | Discharge: 2019-04-06 | Disposition: A | Discharge status: Home / Self Care | Payer: Medicaid Other | Attending: Internal Medicine | Admitting: Internal Medicine

## 2019-04-06 ENCOUNTER — Encounter: Payer: Self-pay | Admitting: Emergency Medicine

## 2019-04-06 DIAGNOSIS — K7460 Unspecified cirrhosis of liver: Secondary | ICD-10-CM | POA: Diagnosis not present

## 2019-04-06 DIAGNOSIS — J45909 Unspecified asthma, uncomplicated: Secondary | ICD-10-CM | POA: Insufficient documentation

## 2019-04-06 DIAGNOSIS — Z791 Long term (current) use of non-steroidal anti-inflammatories (NSAID): Secondary | ICD-10-CM | POA: Insufficient documentation

## 2019-04-06 DIAGNOSIS — Z79899 Other long term (current) drug therapy: Secondary | ICD-10-CM | POA: Diagnosis not present

## 2019-04-06 DIAGNOSIS — Z1211 Encounter for screening for malignant neoplasm of colon: Secondary | ICD-10-CM | POA: Diagnosis not present

## 2019-04-06 DIAGNOSIS — K219 Gastro-esophageal reflux disease without esophagitis: Secondary | ICD-10-CM | POA: Insufficient documentation

## 2019-04-06 DIAGNOSIS — F17200 Nicotine dependence, unspecified, uncomplicated: Secondary | ICD-10-CM | POA: Diagnosis not present

## 2019-04-06 DIAGNOSIS — R1084 Generalized abdominal pain: Secondary | ICD-10-CM | POA: Insufficient documentation

## 2019-04-06 DIAGNOSIS — R1310 Dysphagia, unspecified: Secondary | ICD-10-CM | POA: Diagnosis not present

## 2019-04-06 DIAGNOSIS — K5730 Diverticulosis of large intestine without perforation or abscess without bleeding: Secondary | ICD-10-CM | POA: Diagnosis not present

## 2019-04-06 DIAGNOSIS — Z86010 Personal history of colonic polyps: Secondary | ICD-10-CM | POA: Diagnosis not present

## 2019-04-06 DIAGNOSIS — Z8619 Personal history of other infectious and parasitic diseases: Secondary | ICD-10-CM | POA: Diagnosis not present

## 2019-04-06 DIAGNOSIS — K31819 Angiodysplasia of stomach and duodenum without bleeding: Secondary | ICD-10-CM | POA: Insufficient documentation

## 2019-04-06 DIAGNOSIS — K640 First degree hemorrhoids: Secondary | ICD-10-CM | POA: Insufficient documentation

## 2019-04-06 DIAGNOSIS — K210 Gastro-esophageal reflux disease with esophagitis: Secondary | ICD-10-CM | POA: Diagnosis not present

## 2019-04-06 DIAGNOSIS — K2950 Unspecified chronic gastritis without bleeding: Secondary | ICD-10-CM | POA: Diagnosis not present

## 2019-04-06 DIAGNOSIS — I10 Essential (primary) hypertension: Secondary | ICD-10-CM | POA: Insufficient documentation

## 2019-04-06 HISTORY — PX: COLONOSCOPY WITH PROPOFOL: SHX5780

## 2019-04-06 HISTORY — PX: ESOPHAGOGASTRODUODENOSCOPY (EGD) WITH PROPOFOL: SHX5813

## 2019-04-06 SURGERY — ESOPHAGOGASTRODUODENOSCOPY (EGD) WITH PROPOFOL
Anesthesia: General

## 2019-04-06 MED ORDER — PROPOFOL 500 MG/50ML IV EMUL
INTRAVENOUS | Status: DC | PRN
  Administered 2019-04-06: 120 ug/kg/min via INTRAVENOUS

## 2019-04-06 MED ORDER — MIDAZOLAM HCL 2 MG/2ML IJ SOLN
INTRAMUSCULAR | Status: AC
  Filled 2019-04-06: qty 2

## 2019-04-06 MED ORDER — PROPOFOL 10 MG/ML IV BOLUS
INTRAVENOUS | Status: DC | PRN
  Administered 2019-04-06 (×4): 50 mg via INTRAVENOUS

## 2019-04-06 MED ORDER — LIDOCAINE HCL (CARDIAC) PF 100 MG/5ML IV SOSY
PREFILLED_SYRINGE | INTRAVENOUS | Status: DC | PRN
  Administered 2019-04-06: 100 mg via INTRAVENOUS

## 2019-04-06 MED ORDER — SODIUM CHLORIDE 0.9 % IV SOLN
INTRAVENOUS | Status: DC
  Administered 2019-04-06: 1000 mL via INTRAVENOUS

## 2019-04-06 NOTE — Anesthesia Postprocedure Evaluation (Signed)
Anesthesia Post Note  Patient: Donna Phelps  Procedure(s) Performed: ESOPHAGOGASTRODUODENOSCOPY (EGD) WITH PROPOFOL (N/A ) COLONOSCOPY WITH PROPOFOL (N/A )  Patient location during evaluation: Endoscopy Anesthesia Type: General Level of consciousness: awake and alert and oriented Pain management: pain level controlled Vital Signs Assessment: post-procedure vital signs reviewed and stable Respiratory status: spontaneous breathing, nonlabored ventilation and respiratory function stable Cardiovascular status: blood pressure returned to baseline and stable Postop Assessment: no signs of nausea or vomiting Anesthetic complications: no     Last Vitals:  Vitals:   04/06/19 1101 04/06/19 1244  BP: (!) 156/98 113/72  Pulse: (!) 55 (!) 49  Resp: 17 13  Temp: (!) 35.6 C 36.5 C  SpO2: 99% 97%    Last Pain:  Vitals:   04/06/19 1304  TempSrc:   PainSc: 0-No pain                 Swetha Rayle

## 2019-04-06 NOTE — H&P (Signed)
Outpatient short stay form Pre-procedure 04/06/2019 10:56 AM Teodoro K. Alice Reichert, M.D.  Primary Physician:Elena Adamo, MD  Reason for visit: Dysphagia, chest wall pain, GERD, heme positive stool, personal history of adenomatous colon polyps.  History of present illness: 58 year old female with a history of frequent NSAID use presents with chest wall pain, atypical along with mild dysphagia which is intermittent.  She denies dysphagia today on her preop evaluation.  She has a history of adenomatous colon polyps as well as constipation. Patient ate noodles yesterday for lunch but claims that after her bowel preparation that her stool is of a clear E fluent quality.    Current Facility-Administered Medications:  .  0.9 %  sodium chloride infusion, , Intravenous, Continuous, [place], Benay Pike, MD  Medications Prior to Admission  Medication Sig Dispense Refill Last Dose  . diclofenac sodium (VOLTAREN) 1 % GEL Apply 2 g topically 2 (two) times daily.     Marland Kitchen albuterol (PROVENTIL HFA;VENTOLIN HFA) 108 (90 Base) MCG/ACT inhaler Inhale 2 puffs into the lungs every 6 (six) hours as needed for wheezing or shortness of breath.      . calcium carbonate (TUMS - DOSED IN MG ELEMENTAL CALCIUM) 500 MG chewable tablet Chew 1 tablet by mouth as needed for indigestion or heartburn.     . Cholecalciferol (VITAMIN D PO) Take 2 tablets by mouth daily.     Marland Kitchen lisinopril-hydrochlorothiazide (PRINZIDE,ZESTORETIC) 20-12.5 MG tablet Take 1 tablet by mouth daily.     . naproxen (NAPROSYN) 500 MG tablet Take 500 mg by mouth 2 (two) times daily with a meal.     . omeprazole (PRILOSEC) 40 MG capsule Take 40 mg by mouth daily.        No Known Allergies   Past Medical History:  Diagnosis Date  . Anxiety   . Arthritis   . Asthma   . Chronic hepatitis ([place])    12 wk Harvoni Tx 2019  . Cirrhosis ([place])   . Constipation   . Depression   . GERD (gastroesophageal reflux disease)   . High cholesterol   . Hypertension   .  Prediabetes     Review of systems:  Otherwise negative.    Physical Exam  Gen: Alert, oriented. Appears stated age.  HEENT: [place]/AT. PERRLA. Lungs: CTA, no wheezes. CV: RR nl S1, S2. Abd: soft, benign, no masses. BS+ Ext: No edema. Pulses 2+    Planned procedures: Proceed with EGD and colonoscopy. The patient understands the nature of the planned procedure, indications, risks, alternatives and potential complications including but not limited to bleeding, infection, perforation, damage to internal organs and possible oversedation/side effects from anesthesia. The patient agrees and gives consent to proceed.  Please refer to procedure notes for findings, recommendations and patient disposition/instructions.     Teodoro K. Alice Reichert, M.D. Gastroenterology 04/06/2019  10:56 AM

## 2019-04-06 NOTE — Anesthesia Preprocedure Evaluation (Signed)
Anesthesia Evaluation  Patient identified by MRN, date of birth, ID band Patient awake    Reviewed: Allergy & Precautions, NPO status , Patient's Chart, lab work & pertinent test results  History of Anesthesia Complications Negative for: history of anesthetic complications  Airway Mallampati: II  TM Distance: >3 FB Neck ROM: Full    Dental  (+) Poor Dentition, Missing, Chipped   Pulmonary asthma , neg sleep apnea, Current Smoker and Patient abstained from smoking.,    breath sounds clear to auscultation- rhonchi (-) wheezing      Cardiovascular hypertension, Pt. on medications (-) CAD, (-) Past MI, (-) Cardiac Stents and (-) CABG  Rhythm:Regular Rate:Normal - Systolic murmurs and - Diastolic murmurs    Neuro/Psych neg Seizures PSYCHIATRIC DISORDERS Anxiety Depression negative neurological ROS     GI/Hepatic GERD  ,(+) Cirrhosis       , Hepatitis -, C  Endo/Other  negative endocrine ROSneg diabetes  Renal/GU negative Renal ROS     Musculoskeletal  (+) Arthritis ,   Abdominal (+) - obese,   Peds  Hematology negative hematology ROS (+)   Anesthesia Other Findings Past Medical History: No date: Anxiety No date: Arthritis No date: Asthma No date: Chronic hepatitis ([place])     Comment:  12 wk Harvoni Tx 2019 No date: Cirrhosis ([place]) No date: Constipation No date: Depression No date: GERD (gastroesophageal reflux disease) No date: High cholesterol No date: Hypertension No date: Prediabetes   Reproductive/Obstetrics                             Anesthesia Physical Anesthesia Plan  ASA: III  Anesthesia Plan: General   Post-op Pain Management:    Induction: Intravenous  PONV Risk Score and Plan: 1 and Propofol infusion  Airway Management Planned: Natural Airway  Additional Equipment:   Intra-op Plan:   Post-operative Plan:   Informed Consent: I have reviewed the patients  History and Physical, chart, labs and discussed the procedure including the risks, benefits and alternatives for the proposed anesthesia with the patient or authorized representative who has indicated his/her understanding and acceptance.     Dental advisory given  Plan Discussed with: CRNA and Anesthesiologist  Anesthesia Plan Comments:         Anesthesia Quick Evaluation

## 2019-04-06 NOTE — Interval H&P Note (Signed)
History and Physical Interval Note:  04/06/2019 10:58 AM  Donna Phelps  has presented today for surgery, with the diagnosis of EROSIVE GASTRITIS,DYSPHAGIA,CONSTIPATION,PERSONAL HX.OF COLON POLYPS.  The various methods of treatment have been discussed with the patient and family. After consideration of risks, benefits and other options for treatment, the patient has consented to  Procedure(s): ESOPHAGOGASTRODUODENOSCOPY (EGD) WITH PROPOFOL (N/A) COLONOSCOPY WITH PROPOFOL (N/A) as a surgical intervention.  The patient's history has been reviewed, patient examined, no change in status, stable for surgery.  I have reviewed the patient's chart and labs.  Questions were answered to the patient's satisfaction.     [place], [place]

## 2019-04-06 NOTE — Transfer of Care (Signed)
Immediate Anesthesia Transfer of Care Note  Patient: Donna Phelps  Procedure(s) Performed: ESOPHAGOGASTRODUODENOSCOPY (EGD) WITH PROPOFOL (N/A ) COLONOSCOPY WITH PROPOFOL (N/A )  Patient Location: PACU and Endoscopy Unit  Anesthesia Type:General  Level of Consciousness: drowsy and patient cooperative  Airway & Oxygen Therapy: Patient Spontanous Breathing  Post-op Assessment: Report given to RN and Post -op Vital signs reviewed and stable  Post vital signs: Reviewed and stable  Last Vitals:  Vitals Value Taken Time  BP    Temp    Pulse 51 04/06/19 1244  Resp 13 04/06/19 1244  SpO2 97 % 04/06/19 1244  Vitals shown include unvalidated device data.  Last Pain:  Vitals:   04/06/19 1101  TempSrc: Tympanic  PainSc: 0-No pain         Complications: No apparent anesthesia complications

## 2019-04-06 NOTE — Op Note (Addendum)
[facility] Gastroenterology Patient Name: Donna BlonderLillian Trebilcock Procedure Date: 04/06/2019 12:04 PM MRN: [id number] Account #: [id number] Date of Birth: Jul 13, 1961 Admit Type: Outpatient Age: 58 [facility] Gender: Female Note Status: Finalized Procedure:            Upper GI endoscopy Indications:          Generalized abdominal pain, Dysphagia, Heartburn Providers:            Boykin Nearingeodoro K. Blenda Wisecup MD, MD Medicines:            Propofol per Anesthesia Complications:        No immediate complications. Procedure:            Pre-Anesthesia Assessment:                       - The risks and benefits of the procedure and the                        sedation options and risks were discussed with the                        patient. All questions were answered and informed                        consent was obtained.                       - Patient identification and proposed procedure were                        verified prior to the procedure by the nurse. The                        procedure was verified in the procedure room.                       - ASA Grade Assessment: III - A patient with severe                        systemic disease.                       - After reviewing the risks and benefits, the patient                        was deemed in satisfactory condition to undergo the                        procedure.                       After obtaining informed consent, the endoscope was                        passed under direct vision. Throughout the procedure,                        the patient's blood pressure, pulse, and oxygen                        saturations were monitored continuously. The Endoscope  was introduced through the mouth, and advanced to the                        third part of duodenum. The upper GI endoscopy was                        accomplished without difficulty. The patient tolerated                        the  procedure well. Findings:      Mucosal changes including feline appearance, circumferential folds,       longitudinal markings and vertical lines were found in the entire       esophagus. Esophageal findings were graded using the Eosinophilic       Esophagitis Endoscopic Reference Score (EoE-EREFS) as: Edema Grade 1       Present (decreased clarity or absence of vascular markings), Rings Grade       2 Moderate (distinct rings that do not occlude passage of diagnostic       8-10 mm endoscope), Exudates Grade 0 None (no white lesions seen),       Furrows Grade 1 Present (vertical lines with or without visible depth)       and Stricture none (no stricture found). Biopsies were obtained from the       proximal and distal esophagus with cold forceps for histology of       suspected eosinophilic esophagitis.      Segmental moderate inflammation characterized by erythema was found in       the gastric antrum. Biopsies were taken with a cold forceps for       Helicobacter pylori testing.      The cardia and gastric fundus were normal on retroflexion.      A single 4 mm angioectasia without bleeding was found in the duodenal       bulb.      The second portion of the duodenum and third portion of the duodenum       were normal.      The exam was otherwise without abnormality. Impression:           - Esophageal mucosal changes consistent with                        eosinophilic esophagitis. Biopsied.                       - Gastritis. Biopsied.                       - A single non-bleeding angioectasia in the duodenum.                       - Normal second portion of the duodenum and third                        portion of the duodenum.                       - The examination was otherwise normal. Recommendation:       - Await pathology results.                       - Proceed with colonoscopy Procedure  Code(s):    --- Professional ---                       [phone], Esophagogastroduodenoscopy,  flexible, transoral;                        with biopsy, single or multiple Diagnosis Code(s):    --- Professional ---                       R12, Heartburn                       R13.10, Dysphagia, unspecified                       R10.84, Generalized abdominal pain                       K31.819, Angiodysplasia of stomach and duodenum without                        bleeding                       K29.70, Gastritis, unspecified, without bleeding                       K22.8, Other specified diseases of esophagus CPT copyright 2019 American Medical Association. All rights reserved. The codes documented in this report are preliminary and upon coder review may  be revised to meet current compliance requirements. Stanton Kidney MD, MD 04/06/2019 12:22:47 PM This report has been signed electronically. Number of Addenda: 0 Note Initiated On: 04/06/2019 12:04 PM Estimated Blood Loss: Estimated blood loss: none.      [facility]

## 2019-04-06 NOTE — Op Note (Addendum)
[facility] Patient Name: Donna BlonderLillian Monjaras Procedure Date: 04/06/2019 12:04 PM MRN: [id number] Account #: [id number] Date of Birth: 58 Admit Type: Outpatient Age: 58 [facility] Gender: Female Note Status: Finalized Procedure:            Colonoscopy Indications:          High risk colon cancer surveillance: Personal history                        of multiple (3 or more) adenomas Providers:            Royce Macadamiaeodoro K. Harlen Danford MD, MD Medicines:            Propofol per Anesthesia Complications:        No immediate complications. Procedure:            Pre-Anesthesia Assessment:                       - The risks and benefits of the procedure and the                        sedation options and risks were discussed with the                        patient. All questions were answered and informed                        consent was obtained.                       - Patient identification and proposed procedure were                        verified prior to the procedure by the nurse. The                        procedure was verified in the procedure room.                       - ASA Grade Assessment: III - A patient with severe                        systemic disease.                       - After reviewing the risks and benefits, the patient                        was deemed in satisfactory condition to undergo the                        procedure.                       After obtaining informed consent, the colonoscope was                        passed under direct vision. Throughout the procedure,                        the patient's blood pressure, pulse, and oxygen  saturations were monitored continuously. The                        Colonoscope was introduced through the anus and                        advanced to the the terminal ileum, with identification                        of the ileocecal valve. The colonoscopy  was somewhat                        difficult due to poor endoscopic visualization.                        Successful completion of the procedure was aided by                        lavage. The patient tolerated the procedure well. The                        quality of the bowel preparation was adequate to                        identify polyps 6 mm and larger in size. The terminal                        ileum and the ileocecal valve were photographed. Findings:      The perianal and digital rectal examinations were normal. Pertinent       negatives include normal sphincter tone.      Multiple small-mouthed diverticula were found in the sigmoid colon and       ascending colon.      A large amount of stool was found in the transverse colon, in the       ascending colon and in the cecum, making visualization difficult. Lavage       of the area was performed using a moderate amount of tap water,       resulting in clearance with fair visualization.      Non-bleeding internal hemorrhoids were found during retroflexion. The       hemorrhoids were Grade I (internal hemorrhoids that do not prolapse).      The exam was otherwise without abnormality.      The terminal ileum appeared normal. Impression:           - Diverticulosis in the sigmoid colon and in the                        ascending colon.                       - Stool in the transverse colon, in the ascending colon                        and in the cecum.                       - Non-bleeding internal hemorrhoids.                       - The  examination was otherwise normal.                       - No specimens collected. Recommendation:       - Patient has a contact number available for                        emergencies. The signs and symptoms of potential                        delayed complications were discussed with the patient.                        Return to normal activities tomorrow. Written discharge                         instructions were provided to the patient.                       - Resume previous diet.                       - Continue present medications.                       - Repeat colonoscopy in 2 years for surveillance.                       - CLoser interval for colonoscopy recommended due to                        fair prep, with only fair visualization in the right                        colon.                       - Patient has a contact number available for                        emergencies. The signs and symptoms of potential                        delayed complications were discussed with the patient.                        Return to normal activities tomorrow. Written discharge                        instructions were provided to the patient. Procedure Code(s):    --- Professional ---                       D3570, Colorectal cancer screening; colonoscopy on                        individual at high risk Diagnosis Code(s):    --- Professional ---                       K57.30, Diverticulosis of large intestine without  perforation or abscess without bleeding                       K64.0, First degree hemorrhoids                       Z86.010, Personal history of colonic polyps CPT copyright 2019 American Medical Association. All rights reserved. The codes documented in this report are preliminary and upon coder review may  be revised to meet current compliance requirements. Stanton Kidneyeodoro K Danille Oppedisano MD, MD 04/06/2019 12:45:51 PM This report has been signed electronically. Number of Addenda: 0 Note Initiated On: 04/06/2019 12:04 PM Scope Withdrawal Time: 0 hours 10 minutes 58 seconds  Total Procedure Duration: 0 hours 14 minutes 43 seconds  Estimated Blood Loss: Estimated blood loss: none. Estimated blood loss: none.      [facility]

## 2019-04-06 NOTE — Anesthesia Post-op Follow-up Note (Signed)
Anesthesia QCDR form completed.        

## 2019-04-07 ENCOUNTER — Encounter: Payer: Self-pay | Admitting: Internal Medicine

## 2019-04-07 LAB — SURGICAL PATHOLOGY

## 2019-08-17 ENCOUNTER — Other Ambulatory Visit: Payer: Self-pay | Admitting: Orthopedic Surgery

## 2019-08-17 ENCOUNTER — Other Ambulatory Visit: Payer: Self-pay | Admitting: Family Medicine

## 2019-08-17 DIAGNOSIS — Z1231 Encounter for screening mammogram for malignant neoplasm of breast: Secondary | ICD-10-CM

## 2019-08-18 ENCOUNTER — Encounter
Admission: RE | Admit: 2019-08-18 | Discharge: 2019-08-18 | Disposition: A | Discharge status: Home / Self Care | Payer: Medicaid Other | Source: Ambulatory Visit | Attending: Orthopedic Surgery | Admitting: Orthopedic Surgery

## 2019-08-18 ENCOUNTER — Other Ambulatory Visit: Payer: Self-pay

## 2019-08-18 DIAGNOSIS — Z01811 Encounter for preprocedural respiratory examination: Secondary | ICD-10-CM | POA: Diagnosis present

## 2019-08-18 NOTE — Patient Instructions (Signed)
Your procedure is scheduled on: Thurs 1/28 Report to Day Surgery. [facility] To find out your arrival time please call [phone] between [place] on Wed. 1/27  Remember: Instructions that are not followed completely may result in serious medical risk,  up to and including death, or upon the discretion of your surgeon and anesthesiologist your  surgery may need to be rescheduled.     _X__ 1. Do not eat food after midnight the night before your procedure.                 No gum chewing or hard candies. You may drink clear liquids up to 2 hours                 before you are scheduled to arrive for your surgery- DO not drink clear                 liquids within 2 hours of the start of your surgery.                 Clear Liquids include:  water, apple juice without pulp, clear carbohydrate                 Drink ( Ensure) complete 2 hours before your arrival                Gatorade, Black Coffee or Tea (Do not add                 anything to coffee or tea).  __X__2.  On the morning of surgery brush your teeth with toothpaste and water, you                may rinse your mouth with mouthwash if you wish.  Do not swallow any toothpaste of mouthwash.     _X__ 3.  No Alcohol for 24 hours before or after surgery.   _X__ 4.  Do Not Smoke or use e-cigarettes For 24 Hours Prior to Your Surgery.                 Do not use any chewable tobacco products for at least 6 hours prior to                 surgery.  ____  5.  Bring all medications with you on the day of surgery if instructed.   __x__  6.  Notify your doctor if there is any change in your medical condition      (cold, fever, infections).     Do not wear jewelry, make-up, hairpins, clips or nail polish. Do not wear lotions, powders, or perfumes. You may wear deodorant. Do not shave 48 hours prior to surgery. Men may shave face and neck. Do not bring valuables to the hospital.    [facility] is not responsible  for any belongings or valuables.  Contacts, dentures or bridgework may not be worn into surgery. Leave your suitcase in the car. After surgery it may be brought to your room. For patients admitted to the hospital, discharge time is determined by your treatment team.   Patients discharged the day of surgery will not be allowed to drive home.   Please read over the following fact sheets that you were given:    _x___ Take these medicines the morning of surgery with A SIP OF WATER:    1. omeprazole (PRILOSEC) 40 MG capsule  2.   3.   4.  5.  6.  ____ Fleet Enema (as directed)   _x___ Use CHG Soap as directed  __x__ Use inhalers on the day of surgery albuterol (PROVENTIL HFA;VENTOLIN HFA) 108 (90 Base) MCG/ACT inhaler   Bring with you  ____ Stop metformin 2 days prior to surgery    ____ Take 1/2 of usual insulin dose the night before surgery. No insulin the morning          of surgery.   ____ Stop Coumadin/Plavix/aspirin    __x__ Stop Anti-inflammatories No ibuprofen aleve or aspirin.  May take tylenol   ____ Stop supplements until after surgery.    ____ Bring C-Pap to the hospital.    Practice with incentive spirometer.  You do not need to bring it with you.

## 2019-08-23 ENCOUNTER — Other Ambulatory Visit: Payer: Medicaid Other

## 2019-08-23 ENCOUNTER — Encounter
Admission: RE | Admit: 2019-08-23 | Discharge: 2019-08-23 | Disposition: A | Discharge status: Home / Self Care | Payer: Medicaid Other | Source: Ambulatory Visit | Attending: Orthopedic Surgery | Admitting: Orthopedic Surgery

## 2019-08-23 ENCOUNTER — Other Ambulatory Visit: Payer: Self-pay

## 2019-08-23 DIAGNOSIS — Z01818 Encounter for other preprocedural examination: Secondary | ICD-10-CM | POA: Insufficient documentation

## 2019-08-23 DIAGNOSIS — Z20822 Contact with and (suspected) exposure to covid-19: Secondary | ICD-10-CM | POA: Insufficient documentation

## 2019-08-23 LAB — COMPREHENSIVE METABOLIC PANEL
ALT: 17 U/L (ref 0–44)
AST: 26 U/L (ref 15–41)
Albumin: 3.8 g/dL (ref 3.5–5.0)
Alkaline Phosphatase: 64 U/L (ref 38–126)
Anion gap: 9 (ref 5–15)
BUN: 21 mg/dL — ABNORMAL HIGH (ref 6–20)
CO2: 26 mmol/L (ref 22–32)
Calcium: 9.4 mg/dL (ref 8.9–10.3)
Chloride: 103 mmol/L (ref 98–111)
Creatinine, Ser: 0.83 mg/dL (ref 0.44–1.00)
GFR calc Af Amer: >60 mL/min (ref >60)
GFR calc non Af Amer: >60 mL/min (ref >60)
Glucose, Bld: 96 mg/dL (ref 70–99)
Potassium: 3.9 mmol/L (ref 3.5–5.1)
Sodium: 138 mmol/L (ref 135–145)
Total Bilirubin: 0.4 mg/dL (ref 0.3–1.2)
Total Protein: 7.7 g/dL (ref 6.5–8.1)

## 2019-08-23 LAB — URINALYSIS, ROUTINE W REFLEX MICROSCOPIC
Bilirubin Urine: NEGATIVE
Glucose, UA: NEGATIVE mg/dL
Hgb urine dipstick: NEGATIVE
Ketones, ur: NEGATIVE mg/dL
Nitrite: NEGATIVE
Protein, ur: NEGATIVE mg/dL
Specific Gravity, Urine: 1.019 (ref 1.005–1.030)
pH: 6 (ref 5.0–8.0)

## 2019-08-23 LAB — TYPE AND SCREEN
ABO/RH(D): O POS
Antibody Screen: NEGATIVE

## 2019-08-23 LAB — CBC WITH DIFFERENTIAL/PLATELET
Abs Immature Granulocytes: 0.01 10*3/uL (ref 0.00–0.07)
Basophils Absolute: 0.1 10*3/uL (ref 0.0–0.1)
Basophils Relative: 1 %
Eosinophils Absolute: 0.1 10*3/uL (ref 0.0–0.5)
Eosinophils Relative: 2 %
HCT: 40.1 % (ref 36.0–46.0)
Hemoglobin: 13.1 g/dL (ref 12.0–15.0)
Immature Granulocytes: 0 %
Lymphocytes Relative: 33 %
Lymphs Abs: 2.2 10*3/uL (ref 0.7–4.0)
MCH: 28.2 pg (ref 26.0–34.0)
MCHC: 32.7 g/dL (ref 30.0–36.0)
MCV: 86.4 fL (ref 80.0–100.0)
Monocytes Absolute: 0.7 10*3/uL (ref 0.1–1.0)
Monocytes Relative: 10 %
Neutro Abs: 3.5 10*3/uL (ref 1.7–7.7)
Neutrophils Relative %: 54 %
Platelets: 219 10*3/uL (ref 150–400)
RBC: 4.64 MIL/uL (ref 3.87–5.11)
RDW: 14.6 % (ref 11.5–15.5)
WBC: 6.6 10*3/uL (ref 4.0–10.5)
nRBC: 0 % (ref 0.0–0.2)

## 2019-08-23 LAB — PROTIME-INR
INR: 1 (ref 0.8–1.2)
Prothrombin Time: 12.8 seconds (ref 11.4–15.2)

## 2019-08-23 LAB — SURGICAL PCR SCREEN
MRSA, PCR: NEGATIVE
Staphylococcus aureus: POSITIVE — AB

## 2019-08-23 LAB — APTT: aPTT: 35 seconds (ref 24–36)

## 2019-08-24 LAB — SARS CORONAVIRUS 2 (TAT 6-24 HRS): SARS Coronavirus 2: NEGATIVE

## 2019-08-24 NOTE — Pre-Procedure Instructions (Signed)
U/A result and abnormal PCR screen faxed to Dr Neomia Glass office

## 2019-08-25 ENCOUNTER — Other Ambulatory Visit: Payer: Self-pay

## 2019-08-25 ENCOUNTER — Ambulatory Visit: Payer: Medicaid Other | Admitting: Anesthesiology

## 2019-08-25 ENCOUNTER — Ambulatory Visit: Payer: Medicaid Other

## 2019-08-25 ENCOUNTER — Ambulatory Visit
Admission: RE | Admit: 2019-08-25 | Discharge: 2019-08-26 | Disposition: A | Discharge status: Home / Self Care | Payer: Medicaid Other | Attending: Orthopedic Surgery | Admitting: Orthopedic Surgery

## 2019-08-25 ENCOUNTER — Encounter: Payer: Self-pay | Admitting: Orthopedic Surgery

## 2019-08-25 ENCOUNTER — Encounter
Admission: RE | Disposition: A | Discharge status: Home / Self Care | Payer: Self-pay | Source: Home / Self Care | Attending: Orthopedic Surgery

## 2019-08-25 DIAGNOSIS — M25851 Other specified joint disorders, right hip: Secondary | ICD-10-CM | POA: Diagnosis not present

## 2019-08-25 DIAGNOSIS — I10 Essential (primary) hypertension: Secondary | ICD-10-CM | POA: Diagnosis not present

## 2019-08-25 DIAGNOSIS — M1611 Unilateral primary osteoarthritis, right hip: Secondary | ICD-10-CM | POA: Insufficient documentation

## 2019-08-25 DIAGNOSIS — J45909 Unspecified asthma, uncomplicated: Secondary | ICD-10-CM | POA: Insufficient documentation

## 2019-08-25 DIAGNOSIS — Z79899 Other long term (current) drug therapy: Secondary | ICD-10-CM | POA: Diagnosis not present

## 2019-08-25 DIAGNOSIS — M25751 Osteophyte, right hip: Secondary | ICD-10-CM | POA: Diagnosis not present

## 2019-08-25 DIAGNOSIS — Z419 Encounter for procedure for purposes other than remedying health state, unspecified: Secondary | ICD-10-CM

## 2019-08-25 DIAGNOSIS — M8568 Other cyst of bone, other site: Secondary | ICD-10-CM | POA: Insufficient documentation

## 2019-08-25 DIAGNOSIS — F419 Anxiety disorder, unspecified: Secondary | ICD-10-CM | POA: Diagnosis not present

## 2019-08-25 DIAGNOSIS — Z6827 Body mass index (BMI) 27.0-27.9, adult: Secondary | ICD-10-CM | POA: Insufficient documentation

## 2019-08-25 DIAGNOSIS — K219 Gastro-esophageal reflux disease without esophagitis: Secondary | ICD-10-CM | POA: Insufficient documentation

## 2019-08-25 DIAGNOSIS — Z96641 Presence of right artificial hip joint: Secondary | ICD-10-CM

## 2019-08-25 DIAGNOSIS — E663 Overweight: Secondary | ICD-10-CM | POA: Insufficient documentation

## 2019-08-25 DIAGNOSIS — K739 Chronic hepatitis, unspecified: Secondary | ICD-10-CM | POA: Diagnosis not present

## 2019-08-25 DIAGNOSIS — G8918 Other acute postprocedural pain: Secondary | ICD-10-CM

## 2019-08-25 DIAGNOSIS — K7460 Unspecified cirrhosis of liver: Secondary | ICD-10-CM | POA: Diagnosis not present

## 2019-08-25 DIAGNOSIS — E7800 Pure hypercholesterolemia, unspecified: Secondary | ICD-10-CM | POA: Diagnosis not present

## 2019-08-25 DIAGNOSIS — R7303 Prediabetes: Secondary | ICD-10-CM | POA: Diagnosis not present

## 2019-08-25 DIAGNOSIS — F17200 Nicotine dependence, unspecified, uncomplicated: Secondary | ICD-10-CM | POA: Insufficient documentation

## 2019-08-25 DIAGNOSIS — Z4889 Encounter for other specified surgical aftercare: Secondary | ICD-10-CM

## 2019-08-25 HISTORY — PX: TOTAL HIP ARTHROPLASTY: SHX124

## 2019-08-25 LAB — URINE CULTURE: Culture: >=100000 — AB

## 2019-08-25 LAB — ABO/RH: ABO/RH(D): O POS

## 2019-08-25 SURGERY — ARTHROPLASTY, HIP, TOTAL, ANTERIOR APPROACH
Anesthesia: Spinal | Site: Hip | Laterality: Right

## 2019-08-25 MED ORDER — MIDAZOLAM HCL 5 MG/5ML IJ SOLN
INTRAMUSCULAR | Status: DC | PRN
  Administered 2019-08-25 (×2): 1 mg via INTRAVENOUS

## 2019-08-25 MED ORDER — TRAMADOL HCL 50 MG PO TABS
50.0000 mg | ORAL_TABLET | Freq: Four times a day (QID) | ORAL | Status: DC
  Administered 2019-08-26 (×2): 50 mg via ORAL
  Filled 2019-08-25 (×3): qty 1

## 2019-08-25 MED ORDER — ACETAMINOPHEN 500 MG PO TABS
1000.0000 mg | ORAL_TABLET | Freq: Four times a day (QID) | ORAL | Status: AC
  Administered 2019-08-26 (×2): 1000 mg via ORAL
  Filled 2019-08-25 (×2): qty 2

## 2019-08-25 MED ORDER — LACTATED RINGERS IV SOLN: INTRAVENOUS | Status: DC

## 2019-08-25 MED ORDER — PROPOFOL 500 MG/50ML IV EMUL
INTRAVENOUS | Status: DC | PRN
  Administered 2019-08-25: 125 ug/kg/min via INTRAVENOUS

## 2019-08-25 MED ORDER — LACTATED RINGERS IV BOLUS
250.0000 mL | Freq: Once | INTRAVENOUS | Status: AC
  Administered 2019-08-25: 14:00:00 250 mL via INTRAVENOUS

## 2019-08-25 MED ORDER — CEFAZOLIN SODIUM-DEXTROSE 2-4 GM/100ML-% IV SOLN
2.0000 g | Freq: Four times a day (QID) | INTRAVENOUS | Status: AC
  Administered 2019-08-25 – 2019-08-26 (×2): 2 g via INTRAVENOUS
  Filled 2019-08-25 (×2): qty 100

## 2019-08-25 MED ORDER — PHENOL 1.4 % MT LIQD
1.0000 | OROMUCOSAL | Status: DC | PRN
  Filled 2019-08-25: qty 177

## 2019-08-25 MED ORDER — OXYCODONE HCL 5 MG PO TABS
5.0000 mg | ORAL_TABLET | ORAL | Status: DC | PRN
  Administered 2019-08-25 – 2019-08-26 (×2): 10 mg via ORAL
  Filled 2019-08-25: qty 2

## 2019-08-25 MED ORDER — BUPIVACAINE HCL (PF) 0.5 % IJ SOLN
INTRAMUSCULAR | Status: DC | PRN
  Administered 2019-08-25: 2.5 mL

## 2019-08-25 MED ORDER — CHLORHEXIDINE GLUCONATE 4 % EX LIQD
60.0000 mL | Freq: Once | CUTANEOUS | Status: AC
  Administered 2019-08-25: 4 via TOPICAL

## 2019-08-25 MED ORDER — ENOXAPARIN SODIUM 40 MG/0.4ML ~~LOC~~ SOLN: 40.0000 mg | SUBCUTANEOUS | 0 refills | Status: AC

## 2019-08-25 MED ORDER — PROPOFOL 500 MG/50ML IV EMUL
INTRAVENOUS | Status: AC
  Filled 2019-08-25: qty 50

## 2019-08-25 MED ORDER — ACETAMINOPHEN 325 MG PO TABS
325.0000 mg | ORAL_TABLET | Freq: Four times a day (QID) | ORAL | Status: DC | PRN
  Administered 2019-08-26: 15:00:00 650 mg via ORAL
  Filled 2019-08-25: qty 2

## 2019-08-25 MED ORDER — METOCLOPRAMIDE HCL 5 MG/ML IJ SOLN: 5.0000 mg | Freq: Three times a day (TID) | INTRAMUSCULAR | Status: DC | PRN

## 2019-08-25 MED ORDER — AMOXICILLIN-POT CLAVULANATE 875-125 MG PO TABS: 1.0000 | ORAL_TABLET | Freq: Two times a day (BID) | ORAL | 0 refills | Status: AC

## 2019-08-25 MED ORDER — SODIUM CHLORIDE 0.9 % IV SOLN: INTRAVENOUS | Status: DC

## 2019-08-25 MED ORDER — EPHEDRINE SULFATE 50 MG/ML IJ SOLN
INTRAMUSCULAR | Status: DC | PRN
  Administered 2019-08-25: 5 mg via INTRAVENOUS
  Administered 2019-08-25: 10 mg via INTRAVENOUS
  Administered 2019-08-25: 5 mg via INTRAVENOUS
  Administered 2019-08-25 (×3): 10 mg via INTRAVENOUS

## 2019-08-25 MED ORDER — BUPIVACAINE LIPOSOME 1.3 % IJ SUSP
INTRAMUSCULAR | Status: AC
  Filled 2019-08-25: qty 20

## 2019-08-25 MED ORDER — PHENYLEPHRINE HCL (PRESSORS) 10 MG/ML IV SOLN
INTRAVENOUS | Status: DC | PRN
  Administered 2019-08-25: 10:00:00 20 ug/min via INTRAVENOUS

## 2019-08-25 MED ORDER — HYDROMORPHONE HCL 1 MG/ML IJ SOLN: 0.5000 mg | INTRAMUSCULAR | Status: DC | PRN

## 2019-08-25 MED ORDER — PROPOFOL 10 MG/ML IV BOLUS
INTRAVENOUS | Status: DC | PRN
  Administered 2019-08-25: 30 mg via INTRAVENOUS

## 2019-08-25 MED ORDER — ACETAMINOPHEN 500 MG PO TABS
ORAL_TABLET | ORAL | Status: AC
  Administered 2019-08-25: 18:00:00 1000 mg via ORAL
  Filled 2019-08-25: qty 2

## 2019-08-25 MED ORDER — CEFAZOLIN SODIUM-DEXTROSE 2-4 GM/100ML-% IV SOLN
INTRAVENOUS | Status: AC
  Filled 2019-08-25: qty 100

## 2019-08-25 MED ORDER — BUPIVACAINE-EPINEPHRINE 0.25% -1:200000 IJ SOLN
INTRAMUSCULAR | Status: DC | PRN
  Administered 2019-08-25: 30 mL

## 2019-08-25 MED ORDER — LACTATED RINGERS IV BOLUS
500.0000 mL | Freq: Once | INTRAVENOUS | Status: AC
  Administered 2019-08-25: 12:00:00 500 mL via INTRAVENOUS

## 2019-08-25 MED ORDER — SODIUM CHLORIDE FLUSH 0.9 % IV SOLN
INTRAVENOUS | Status: AC
  Filled 2019-08-25: qty 40

## 2019-08-25 MED ORDER — CEFAZOLIN SODIUM-DEXTROSE 2-4 GM/100ML-% IV SOLN
2.0000 g | INTRAVENOUS | Status: AC
  Administered 2019-08-25: 10:00:00 2 g via INTRAVENOUS

## 2019-08-25 MED ORDER — SODIUM CHLORIDE 0.9 % IV SOLN
INTRAVENOUS | Status: DC | PRN
  Administered 2019-08-25: 60 mL

## 2019-08-25 MED ORDER — OXYCODONE HCL 5 MG PO TABS
10.0000 mg | ORAL_TABLET | ORAL | Status: DC | PRN
  Filled 2019-08-25: qty 2
  Filled 2019-08-25: qty 3
  Filled 2019-08-25: qty 2

## 2019-08-25 MED ORDER — ONDANSETRON HCL 4 MG/2ML IJ SOLN: 4.0000 mg | Freq: Four times a day (QID) | INTRAMUSCULAR | Status: DC | PRN

## 2019-08-25 MED ORDER — ONDANSETRON HCL 4 MG/2ML IJ SOLN: 4.0000 mg | Freq: Once | INTRAMUSCULAR | Status: DC | PRN

## 2019-08-25 MED ORDER — ONDANSETRON HCL 4 MG PO TABS: 4.0000 mg | ORAL_TABLET | Freq: Four times a day (QID) | ORAL | Status: DC | PRN

## 2019-08-25 MED ORDER — BUPIVACAINE HCL (PF) 0.25 % IJ SOLN
INTRAMUSCULAR | Status: AC
  Filled 2019-08-25: qty 30

## 2019-08-25 MED ORDER — NEOMYCIN-POLYMYXIN B GU 40-200000 IR SOLN
Status: AC
  Filled 2019-08-25: qty 20

## 2019-08-25 MED ORDER — METOCLOPRAMIDE HCL 10 MG PO TABS: 5.0000 mg | ORAL_TABLET | Freq: Three times a day (TID) | ORAL | Status: DC | PRN

## 2019-08-25 MED ORDER — CEFAZOLIN SODIUM-DEXTROSE 2-4 GM/100ML-% IV SOLN
INTRAVENOUS | Status: AC
  Administered 2019-08-25: 18:00:00 2 g via INTRAVENOUS
  Filled 2019-08-25: qty 100

## 2019-08-25 MED ORDER — MENTHOL 3 MG MT LOZG
1.0000 | LOZENGE | OROMUCOSAL | Status: DC | PRN
  Filled 2019-08-25: qty 9

## 2019-08-25 MED ORDER — FENTANYL CITRATE (PF) 100 MCG/2ML IJ SOLN: 25.0000 ug | INTRAMUSCULAR | Status: DC | PRN

## 2019-08-25 MED ORDER — VASOPRESSIN 20 UNIT/ML IV SOLN
INTRAVENOUS | Status: DC | PRN
  Administered 2019-08-25 (×5): 1 m[IU] via INTRAVENOUS

## 2019-08-25 MED ORDER — ENOXAPARIN SODIUM 40 MG/0.4ML ~~LOC~~ SOLN
40.0000 mg | SUBCUTANEOUS | Status: DC
  Administered 2019-08-26: 12:00:00 40 mg via SUBCUTANEOUS
  Filled 2019-08-25: qty 0.4

## 2019-08-25 MED ORDER — METHOCARBAMOL 1000 MG/10ML IJ SOLN
500.0000 mg | Freq: Four times a day (QID) | INTRAVENOUS | Status: DC | PRN
  Filled 2019-08-25 (×2): qty 5

## 2019-08-25 MED ORDER — TRAMADOL HCL 50 MG PO TABS: 50.0000 mg | ORAL_TABLET | Freq: Four times a day (QID) | ORAL | 0 refills | Status: AC

## 2019-08-25 MED ORDER — TRAMADOL HCL 50 MG PO TABS
ORAL_TABLET | ORAL | Status: AC
  Administered 2019-08-25: 18:00:00 50 mg via ORAL
  Filled 2019-08-25: qty 1

## 2019-08-25 MED ORDER — DOCUSATE SODIUM 100 MG PO CAPS
100.0000 mg | ORAL_CAPSULE | Freq: Two times a day (BID) | ORAL | Status: DC
  Administered 2019-08-25 – 2019-08-26 (×2): 100 mg via ORAL
  Filled 2019-08-25 (×3): qty 1

## 2019-08-25 MED ORDER — OXYCODONE HCL 5 MG PO TABS: 5.0000 mg | ORAL_TABLET | ORAL | 0 refills | Status: DC | PRN

## 2019-08-25 MED ORDER — MIDAZOLAM HCL 2 MG/2ML IJ SOLN
INTRAMUSCULAR | Status: AC
  Filled 2019-08-25: qty 2

## 2019-08-25 MED ORDER — TRANEXAMIC ACID-NACL 1000-0.7 MG/100ML-% IV SOLN
1000.0000 mg | INTRAVENOUS | Status: AC
  Administered 2019-08-25: 10:00:00 1000 mg via INTRAVENOUS

## 2019-08-25 MED ORDER — TRANEXAMIC ACID-NACL 1000-0.7 MG/100ML-% IV SOLN
INTRAVENOUS | Status: AC
  Filled 2019-08-25: qty 100

## 2019-08-25 MED ORDER — DEXAMETHASONE SODIUM PHOSPHATE 10 MG/ML IJ SOLN
INTRAMUSCULAR | Status: DC | PRN
  Administered 2019-08-25: 10 mg via INTRAVENOUS

## 2019-08-25 MED ORDER — EPINEPHRINE PF 1 MG/ML IJ SOLN
INTRAMUSCULAR | Status: AC
  Filled 2019-08-25: qty 1

## 2019-08-25 MED ORDER — METHOCARBAMOL 500 MG PO TABS: 500.0000 mg | ORAL_TABLET | Freq: Four times a day (QID) | ORAL | Status: DC | PRN

## 2019-08-25 MED ORDER — NEOMYCIN-POLYMYXIN B GU 40-200000 IR SOLN
Status: DC | PRN
  Administered 2019-08-25: 4 mL

## 2019-08-25 SURGICAL SUPPLY — 60 items
BLADE SAGITTAL AGGR TOOTH XLG (BLADE) ×2 IMPLANT
BNDG COHESIVE 6X5 TAN STRL LF (GAUZE/BANDAGES/DRESSINGS) ×6 IMPLANT
CANISTER SUCT 1200ML W/VALVE (MISCELLANEOUS) ×2 IMPLANT
CANISTER WOUND CARE 500ML ATS (WOUND CARE) ×2 IMPLANT
CHLORAPREP W/TINT 26 (MISCELLANEOUS) ×2 IMPLANT
COVER BACK TABLE REUSABLE LG (DRAPES) ×2 IMPLANT
COVER WAND RF STERILE (DRAPES) ×2 IMPLANT
DRAPE 3/4 80X56 (DRAPES) ×6 IMPLANT
DRAPE C-ARM XRAY 36X54 (DRAPES) ×2 IMPLANT
DRAPE INCISE IOBAN 66X60 STRL (DRAPES) IMPLANT
DRAPE POUCH INSTRU U-SHP 10X18 (DRAPES) ×2 IMPLANT
DRESSING SURGICEL FIBRLLR 1X2 (HEMOSTASIS) ×2 IMPLANT
DRSG OPSITE POSTOP 4X8 (GAUZE/BANDAGES/DRESSINGS) ×6 IMPLANT
DRSG SURGICEL FIBRILLAR 1X2 (HEMOSTASIS) ×4
ELECT BLADE 6.5 EXT (BLADE) ×2 IMPLANT
ELECT REM PT RETURN 9FT ADLT (ELECTROSURGICAL) ×2
ELECTRODE REM PT RTRN 9FT ADLT (ELECTROSURGICAL) ×1 IMPLANT
GLOVE BIOGEL PI IND STRL 9 (GLOVE) ×1 IMPLANT
GLOVE BIOGEL PI INDICATOR 9 (GLOVE) ×1
GLOVE SURG SYN 9.0  PF PI (GLOVE) ×2
GLOVE SURG SYN 9.0 PF PI (GLOVE) ×2 IMPLANT
GOWN SRG 2XL LVL 4 RGLN SLV (GOWNS) ×1 IMPLANT
GOWN STRL NON-REIN 2XL LVL4 (GOWNS) ×1
GOWN STRL REUS W/ TWL LRG LVL3 (GOWN DISPOSABLE) ×1 IMPLANT
GOWN STRL REUS W/TWL LRG LVL3 (GOWN DISPOSABLE) ×1
HEAD FEMORAL 28MM SZ S (Head) ×2 IMPLANT
HEMOVAC 400CC 10FR (MISCELLANEOUS) IMPLANT
HOLDER FOLEY CATH W/STRAP (MISCELLANEOUS) IMPLANT
HOOD PEEL AWAY FLYTE STAYCOOL (MISCELLANEOUS) ×2 IMPLANT
KIT PREVENA INCISION MGT 13 (CANNISTER) IMPLANT
LINER DBL MOB SZ 0 52MM (Liner) ×2 IMPLANT
MAT ABSORB  FLUID 56X50 GRAY (MISCELLANEOUS) ×1
MAT ABSORB FLUID 56X50 GRAY (MISCELLANEOUS) ×1 IMPLANT
NDL SAFETY ECLIPSE 18X1.5 (NEEDLE) ×1 IMPLANT
NEEDLE HYPO 18GX1.5 SHARP (NEEDLE) ×1
NEEDLE SPNL 20GX3.5 QUINCKE YW (NEEDLE) ×4 IMPLANT
NS IRRIG 1000ML POUR BTL (IV SOLUTION) ×2 IMPLANT
PACK HIP COMPR (MISCELLANEOUS) ×2 IMPLANT
PENCIL SMOKE EVACUATOR COATED (MISCELLANEOUS) IMPLANT
SCALPEL PROTECTED #10 DISP (BLADE) ×4 IMPLANT
SHELL ACETABULAR SZ 52 DM (Shell) ×2 IMPLANT
SOL PREP PVP 2OZ (MISCELLANEOUS) ×2
SOLUTION PREP PVP 2OZ (MISCELLANEOUS) ×1 IMPLANT
SPONGE DRAIN TRACH 4X4 STRL 2S (GAUZE/BANDAGES/DRESSINGS) IMPLANT
STAPLER SKIN PROX 35W (STAPLE) ×2 IMPLANT
STEM FEMORAL SZ2 STD COLLARED (Stem) ×2 IMPLANT
STRAP SAFETY 5IN WIDE (MISCELLANEOUS) ×2 IMPLANT
SUT DVC 2 QUILL PDO  T11 36X36 (SUTURE) ×1
SUT DVC 2 QUILL PDO T11 36X36 (SUTURE) ×1 IMPLANT
SUT SILK 0 (SUTURE) ×1
SUT SILK 0 30XBRD TIE 6 (SUTURE) ×1 IMPLANT
SUT V-LOC 90 ABS DVC 3-0 CL (SUTURE) ×2 IMPLANT
SUT VIC AB 1 CT1 36 (SUTURE) ×2 IMPLANT
SYR 20ML LL LF (SYRINGE) ×2 IMPLANT
SYR 30ML LL (SYRINGE) ×2 IMPLANT
SYR 50ML LL SCALE MARK (SYRINGE) ×4 IMPLANT
SYR BULB IRRIG 60ML STRL (SYRINGE) ×2 IMPLANT
TAPE MICROFOAM 4IN (TAPE) IMPLANT
TOWEL OR 17X26 4PK STRL BLUE (TOWEL DISPOSABLE) ×2 IMPLANT
TRAY FOLEY MTR SLVR 16FR STAT (SET/KITS/TRAYS/PACK) IMPLANT

## 2019-08-25 NOTE — Anesthesia Preprocedure Evaluation (Signed)
Anesthesia Evaluation  Patient identified by MRN, date of birth, ID band Patient awake    Reviewed: Allergy & Precautions, NPO status , Patient's Chart, lab work & pertinent test results  History of Anesthesia Complications Negative for: history of anesthetic complications  Airway Mallampati: II  TM Distance: >3 FB Neck ROM: Full    Dental  (+) Poor Dentition, Missing, Chipped   Pulmonary neg shortness of breath, asthma , neg sleep apnea, neg recent URI, Current Smoker and Patient abstained from smoking.,    breath sounds clear to auscultation- rhonchi (-) wheezing      Cardiovascular hypertension, Pt. on medications (-) angina(-) CAD, (-) Past MI, (-) Cardiac Stents and (-) CABG  Rhythm:Regular Rate:Normal - Systolic murmurs and - Diastolic murmurs    Neuro/Psych neg Seizures PSYCHIATRIC DISORDERS Anxiety Depression negative neurological ROS     GI/Hepatic GERD  ,(+) Cirrhosis       , Hepatitis -, C  Endo/Other  negative endocrine ROSneg diabetes  Renal/GU negative Renal ROS     Musculoskeletal  (+) Arthritis ,   Abdominal (+) - obese,   Peds  Hematology negative hematology ROS (+)   Anesthesia Other Findings Past Medical History: No date: Anxiety No date: Arthritis No date: Asthma No date: Chronic hepatitis (HCC)     Comment:  12 wk Harvoni Tx 2019 No date: Cirrhosis (HCC) No date: Constipation No date: Depression No date: GERD (gastroesophageal reflux disease) No date: High cholesterol No date: Hypertension No date: Prediabetes   Reproductive/Obstetrics                             Anesthesia Physical  Anesthesia Plan  ASA: III  Anesthesia Plan: Spinal   Post-op Pain Management:    Induction: Intravenous  PONV Risk Score and Plan: 1 and Propofol infusion and TIVA  Airway Management Planned: Natural Airway and Nasal Cannula  Additional Equipment:   Intra-op Plan:    Post-operative Plan:   Informed Consent: I have reviewed the patients History and Physical, chart, labs and discussed the procedure including the risks, benefits and alternatives for the proposed anesthesia with the patient or authorized representative who has indicated his/her understanding and acceptance.     Dental advisory given  Plan Discussed with: CRNA and Anesthesiologist  Anesthesia Plan Comments:         Anesthesia Quick Evaluation

## 2019-08-25 NOTE — Progress Notes (Signed)
Dr. Rosita Kea notified of positive urine culture. States he will prescribe antibiotics for patient to take at home.

## 2019-08-25 NOTE — Anesthesia Procedure Notes (Deleted)
Spinal  Patient location during procedure: OR Start time: 08/25/2019 9:54 AM End time: 08/25/2019 9:57 AM Staffing Performed: resident/CRNA  Resident/CRNA: Harlow Ohms, RN Preanesthetic Checklist Completed: patient identified, IV checked, site marked, risks and benefits discussed, surgical consent, monitors and equipment checked, pre-op evaluation and timeout performed Spinal Block Patient position: sitting Prep: Betadine Patient monitoring: heart rate, continuous pulse ox, blood pressure and cardiac monitor Approach: midline Location: L3-4 Injection technique: single-shot Needle Needle type: Whitacre and Introducer  Needle gauge: 25 G Needle length: 9 cm Additional Notes Negative paresthesia. Negative blood return. Positive free-flowing CSF. Expiration date of kit checked and confirmed. Patient tolerated procedure well, without complications.

## 2019-08-25 NOTE — Anesthesia Procedure Notes (Addendum)
Spinal  Patient location during procedure: OR Start time: 08/25/2019 9:54 AM End time: 08/25/2019 10:02 AM Staffing Anesthesiologist: Karenz, Andrew, MD Resident/CRNA: Noles, Mark, CRNA Other anesthesia staff: Benton, Jeffrey, RN Preanesthetic Checklist Completed: patient identified, IV checked, site marked, risks and benefits discussed, surgical consent, monitors and equipment checked, pre-op evaluation and timeout performed Spinal Block Patient position: sitting Prep: Betadine Patient monitoring: heart rate, continuous pulse ox, blood pressure and cardiac monitor Approach: midline Location: L4-5 Injection technique: single-shot Needle Needle type: Whitacre and Introducer  Needle gauge: 24 G Needle length: 9 cm Additional Notes Negative paresthesia. Negative blood return. Positive free-flowing CSF. Expiration date of kit checked and confirmed. Patient tolerated procedure well, without complications.       

## 2019-08-25 NOTE — H&P (Signed)
Reviewed paper H+P, will be scanned into chart. No changes noted.  

## 2019-08-25 NOTE — Evaluation (Signed)
Physical Therapy Evaluation Patient Details Name: Donna Phelps MRN: [id number] DOB: 09/27/1960 Today's Date: 08/25/2019   History of Present Illness  Pt is a 59 yo female diagnosed with OA of the R hip and is s/p elective R THA. PMH includes: asthma, smoker, anxiety, hep-c, depression, GERD, and HTN.    Clinical Impression  Pt pleasant and motivated to participate during the session but was limited during multiple amb sessions by dizziness.  Pt's BP taken in sitting after c/o dizziness with initial amb attempt with nursing present with BP WNL and actually a little higher than her earlier supine BP.  Pt participated in multiple training bouts of sup to/from sit, sit to/from stand transfers, and therex without issue but each time she attempted amb she again c/o dizziness and requested to return to sitting.  Pt's max amb distance was grossly 5' with pt generally steady but with some difficulty advancing the RLE at times.  Pt should make good progress while in acute care but would not be safe to return to her prior living situation at this time secondary to dizziness with amb.  Pt will benefit from HHPT services once pt is safe for discharge to address deficits listed in patient problem list for decreased caregiver assistance and eventual return to PLOF.      Follow Up Recommendations Home health PT;Supervision for mobility/OOB    Equipment Recommendations  Rolling walker with 5" wheels;3in1 (PT)    Recommendations for Other Services       Precautions / Restrictions Precautions Precautions: Fall;Anterior Hip Precaution Booklet Issued: Yes (comment) Restrictions Weight Bearing Restrictions: Yes RLE Weight Bearing: Weight bearing as tolerated      Mobility  Bed Mobility Overal bed mobility: Modified Independent             General bed mobility comments: Extra time and effort but no physical assistance required  Transfers Overall transfer level: Needs assistance Equipment  used: Rolling walker (2 wheeled) Transfers: Sit to/from Stand Sit to Stand: Min guard         General transfer comment: Mod verbal and tactile cues for sequencing  Ambulation/Gait   Gait Distance (Feet): 5 Feet x 1, 3 Feet x 2 Assistive device: Rolling walker (2 wheeled) Gait Pattern/deviations: Step-to pattern;Decreased step length - left;Decreased stance time - right Gait velocity: decreased   General Gait Details: Pt able to amb short distances in side stepping and forward amb with occasional min difficulty advancing the RLW; Pt limited by c/o dizziness during amb with BP taken in sitting and slightly elevated from baseline  Stairs Stairs: (deferred for pt safety)          Wheelchair Mobility    Modified Rankin (Stroke Patients Only)       Balance Overall balance assessment: Needs assistance Sitting-balance support: Feet supported;Single extremity supported Sitting balance-Leahy Scale: Good     Standing balance support: Bilateral upper extremity supported Standing balance-Leahy Scale: Fair Standing balance comment: Mod lean on the RW for support                             Pertinent Vitals/Pain Pain Assessment: Faces Faces Pain Scale: Hurts little more Pain Location: R hip with mobility Pain Descriptors / Indicators: Other (Comment)(Pt unable to describe pain) Pain Intervention(s): Monitored during session;Premedicated before session    [place] expects to be discharged to:: Private residence Living Arrangements: Alone Available Help at Discharge: Friend(s);Other (Comment)(Boyfriend) Type of Home:  House Home Access: Stairs to enter Entrance Stairs-Rails: None Entrance Stairs-Number of Steps: 1 Home Layout: One level Home Equipment: None      Prior Function Level of Independence: Independent         Comments: Ind amb community distances without an AD, no fall history, Ind with ADLs     Hand Dominance         Extremity/Trunk Assessment   Upper Extremity Assessment Upper Extremity Assessment: Overall WFL for tasks assessed    Lower Extremity Assessment Lower Extremity Assessment: Generalized weakness;RLE deficits/detail RLE Deficits / Details: BLE ankle DF and PF strength and AROM WFL, R hip flex 2+/5 RLE Sensation: WNL       Communication   Communication: No difficulties  Cognition Arousal/Alertness: Awake/alert Behavior During Therapy: WFL for tasks assessed/performed Overall Cognitive Status: Within Functional Limits for tasks assessed                                        General Comments      Exercises Total Joint Exercises Ankle Circles/Pumps: AROM;Strengthening;Both;10 reps Quad Sets: Strengthening;Both;10 reps Gluteal Sets: Strengthening;Both;10 reps Heel Slides: AROM;Both;10 reps Long Arc Quad: AROM;Strengthening;Both;10 reps;15 reps Knee Flexion: AROM;Strengthening;Both;15 reps;10 reps Marching in Standing: AROM;Both;10 reps;Standing Other Exercises Other Exercises: HEP education/review per handout   Assessment/Plan    PT Assessment Patient needs continued PT services  PT Problem List Decreased strength;Decreased activity tolerance;Decreased balance;Decreased mobility;Decreased knowledge of use of DME       PT Treatment Interventions DME instruction;Gait training;Stair training;Functional mobility training;Therapeutic activities;Therapeutic exercise;Balance training;Patient/family education    PT Goals (Current goals can be found in the Care Plan section)  Acute Rehab PT Goals Patient Stated Goal: To walk better PT Goal Formulation: With patient Time For Goal Achievement: 09/07/19 Potential to Achieve Goals: Good    Frequency BID   Barriers to discharge        Co-evaluation               AM-PAC PT "6 Clicks" Mobility  Outcome Measure Help needed turning from your back to your side while in a flat bed without using bedrails?: A  Little Help needed moving from lying on your back to sitting on the side of a flat bed without using bedrails?: A Little Help needed moving to and from a bed to a chair (including a wheelchair)?: A Little Help needed standing up from a chair using your arms (e.g., wheelchair or bedside chair)?: A Little Help needed to walk in hospital room?: A Lot Help needed climbing 3-5 steps with a railing? : Total 6 Click Score: 15    End of Session Equipment Utilized During Treatment: Gait belt Activity Tolerance: Other (comment)(Pt limited by dizziness) Patient left: in chair;with nursing/sitter in room Nurse Communication: Mobility status PT Visit Diagnosis: Other abnormalities of gait and mobility (R26.89);Muscle weakness (generalized) (M62.81);Pain Pain - Right/Left: Right Pain - part of body: Hip    Time: 1426-1510 PT Time Calculation (min) (ACUTE ONLY): 44 min   Charges:   PT Evaluation $PT Eval Moderate Complexity: 1 Mod PT Treatments $Gait Training: 8-22 mins $Therapeutic Exercise: 8-22 mins        D. Elly Modena PT, DPT 08/25/19, 3:40 PM

## 2019-08-25 NOTE — Op Note (Signed)
08/25/2019  11:42 AM  PATIENT:  Donna Phelps  59 y.o. female  PRE-OPERATIVE DIAGNOSIS:  PRIMARY OSTEOARTHRITIS OF RIGHT HIP  POST-OPERATIVE DIAGNOSIS:  PRIMARY OSTEOARTHRITIS RIGHT HIP  PROCEDURE:  Procedure(s): RIGHT TOTAL HIP ARTHROPLASTY ANTERIOR APPROACH (Right)  SURGEON: Leitha Schuller, MD  ASSISTANTS: None  ANESTHESIA:   spinal  EBL:  Total I/O In: 1000 [I.V.:1000] Out: 300 [Blood:300]  BLOOD ADMINISTERED:none  DRAINS: none   LOCAL MEDICATIONS USED:  MARCAINE    and OTHER Exparel  SPECIMEN:  Source of Specimen:  Right femoral head  DISPOSITION OF SPECIMEN:  PATHOLOGY  COUNTS:  YES  TOURNIQUET:  * No tourniquets in log *  IMPLANTS: Medacta AMIS standard 2, 52 mm Mpact DM cup and liner with ceramic S 28 mm head  DICTATION: .Dragon Dictation   The patient was brought to the operating room and after spinal anesthesia was obtained patient was placed on the operative table with the ipsilateral foot into the Medacta attachment, contralateral leg on a well-padded table. C-arm was brought in and preop template x-ray taken. After prepping and draping in usual sterile fashion appropriate patient identification and timeout procedures were completed. Anterior approach to the hip was obtained and centered over the greater trochanter and TFL muscle. The subcutaneous tissue was incised hemostasis being achieved by electrocautery. TFL fascia was incised and the muscle retracted laterally deep retractor placed. The lateral femoral circumflex vessels were identified and ligated. The anterior capsule was exposed and a capsulotomy performed. The neck was identified and a femoral neck cut carried out with a saw. The head was removed without difficulty and showed sclerotic femoral head and acetabulum. Reaming was carried out to 50 mm and a 52 mm cup trial gave appropriate tightness to the acetabular component a 52 DM cup was impacted into position. The leg was then externally rotated and  ischiofemoral and pubofemoral releases carried out. The femur was sequentially broached to a size 2, size 2 standard with S head trials were placed and the final components chosen. The 2 standard stem was inserted along with a ceramic S 28 mm head and 52 mm liner. The hip was reduced and was stable the wound was thoroughly irrigated with fibrillar placed along the posterior capsule and medial neck.  Injection of half percent Sensorcaine with epinephrine and Exparel was done throughout the case the deep fascia ws closed using a heavy Quill.Marland Kitchen3-0 V-loc to close the skin with skin staples. Xeroform and honeycomb dressing applied, patient was sent to recovery in stable condition.   PLAN OF CARE: Discharge to home after PACU

## 2019-08-25 NOTE — Discharge Instructions (Signed)
Total Hip Replacement, Anterior, Care After This sheet gives you information about how to care for yourself after your procedure. Your doctor may also give you more specific instructions. If you have problems or questions, contact your doctor. What can I expect after the procedure? After the procedure, it is common to have:  Pain.  Stiffness.  Discomfort. Follow these instructions at home: Medicines  Take over-the-counter and prescription medicines only as told by your doctor.  If you were prescribed a medicine to thin your blood (anticoagulant), take it as told by your doctor. Surgery cut care   Follow instructions from your doctor about how to take care of your cut (incision) from surgery. Make sure you: ? Wash your hands with soap and water before you change your bandage (dressing). If you cannot use soap and water, use alcohol-based hand sanitizer. ? Change your bandage as told by your doctor. ? Leave stitches (sutures), skin glue, or skin tape (adhesive) strips in place. They may need to stay in place for 2 weeks or longer. If tape strips get loose and curl up, you may trim the loose edges. Do not remove tape strips completely unless your doctor tells you to do that.  Check your surgical cut every day for signs of infection. Check for: ? Redness, swelling, or pain. ? Fluid or blood. ? Warmth. ? Pus or a bad smell. Bathing  Do not take baths, swim, or use a hot tub until your doctor says it is okay.  Keep the bandage dry until your doctor says it can be removed. Managing pain, stiffness, and swelling   If directed, put ice on the hip area. ? Put ice in a plastic bag. ? Place a towel between your skin and the bag. ? Leave the ice on for 20 minutes, 2-3 times a day.  Move your toes often to avoid stiffness and to lessen swelling.  Raise (elevate) your leg above the level of your heart while you are sitting or lying down. Activity  Rest as told by your doctor.  Do  not sit for a long time without moving. Get up to take short walks every 1-2 hours. This is important. Ask for help if you feel weak or unsteady.  Do exercises as told by your doctor or physical therapist.  Use a walker, crutches, or a cane as told by your doctor. ? You may use your legs to support (bear) your body weight as told by your doctor. Follow instructions about how much weight you may safely support on your affected leg (weight-bearing restrictions). ? A physical therapist may show you how to get out of a bed and chair and how to go up and down stairs. You will first do this with a walker, crutches, or a cane. Then you will do it without any of these devices. ? Once you are able to walk without a limp, you may stop using a walker, crutches, or cane.  Return to your normal activities as told by your doctor. Ask your doctor what activities are safe for you. Safety  To help prevent falls: ? Keep floors clear of objects you may trip over. ? Place items that you may need within easy reach.  Wear an apron or tool belt with pockets for carrying objects. This leaves your hands free to help with your balance. Driving  Do not drive or use heavy machinery while taking prescription pain medicine.  Ask your doctor when it is safe to drive. General instructions  Wear compression stockings as told by your doctor. These help to prevent blood clots and reduce swelling in your legs.  Keep doing breathing exercises as told by your doctor. This helps prevent lung infection.  If you are taking prescription pain medicine, take actions to prevent or treat constipation. Your doctor may suggest that you: ? Drink enough fluid to keep your pee (urine) pale yellow. ? Eat foods that are high in fiber. These include fresh fruits and vegetables, whole grains, and beans. ? Limit foods that are high in fat and sugar. These include fried or sweet foods. ? Take an over-the-counter or prescription medicine for  constipation.  Do not use any products that have nicotine or tobacco in them, such as cigarettes and e-cigarettes. These can delay bone healing. If you need help quitting, ask your doctor.  Tell your doctor if you plan to have dental work. Also: ? Tell your dentist about your joint replacement. ? Ask your doctor if there are instructions you need to follow before dental care and routine cleanings.  Keep all follow-up visits as told by your doctor. This is important. Contact a doctor if:  You have a fever or chills.  You have a cough.  You feel short of breath.  Your medicine is not helping your pain.  You have any of these at or near your cut from surgery: ? Redness, swelling, or pain. ? Fluid or blood. ? An area that feels warm when you touch it. ? Pus or a bad smell. Get help right away if:  You have very bad pain.  You have trouble breathing.  You have chest pain.  You have redness, swelling, pain, and warmth in your calf or leg. Summary  Follow instructions from your doctor about how to take care of your surgery cut (incision).  Do not take baths, swim, or use a hot tub until your doctor says it is okay.  Use crutches, a walker, or a cane as told by your doctor.  If you were prescribed a medicine to thin your blood (anticoagulant), take it as told by your doctor. This information is not intended to replace advice given to you by your health care provider. Make sure you discuss any questions you have with your health care provider. Document Revised: 11/22/2018 Document Reviewed: 10/28/2017 Elsevier Patient Education  2020 [employer].

## 2019-08-25 NOTE — Transfer of Care (Signed)
Immediate Anesthesia Transfer of Care Note  Patient: Donna Phelps  Procedure(s) Performed: RIGHT TOTAL HIP ARTHROPLASTY ANTERIOR APPROACH (Right Hip)  Patient Location: PACU  Anesthesia Type:Spinal  Level of Consciousness: awake  Airway & Oxygen Therapy: Patient Spontanous Breathing and Patient connected to face mask oxygen  Post-op Assessment: Report given to RN and Post -op Vital signs reviewed and stable  Post vital signs: Reviewed and stable  Last Vitals:  Vitals Value Taken Time  BP 123/74 08/25/19 1134  Temp    Pulse 57 08/25/19 1136  Resp 15 08/25/19 1136  SpO2 98 % 08/25/19 1136  Vitals shown include unvalidated device data.  Last Pain:  Vitals:   08/25/19 0716  TempSrc: Temporal  PainSc: 0-No pain         Complications: No apparent anesthesia complications

## 2019-08-26 DIAGNOSIS — M1611 Unilateral primary osteoarthritis, right hip: Secondary | ICD-10-CM | POA: Diagnosis not present

## 2019-08-26 LAB — BASIC METABOLIC PANEL
Anion gap: 3 — ABNORMAL LOW (ref 5–15)
BUN: 16 mg/dL (ref 6–20)
CO2: 30 mmol/L (ref 22–32)
Calcium: 8.6 mg/dL — ABNORMAL LOW (ref 8.9–10.3)
Chloride: 108 mmol/L (ref 98–111)
Creatinine, Ser: 0.81 mg/dL (ref 0.44–1.00)
GFR calc Af Amer: >60 mL/min (ref >60)
GFR calc non Af Amer: >60 mL/min (ref >60)
Glucose, Bld: 147 mg/dL — ABNORMAL HIGH (ref 70–99)
Potassium: 3.6 mmol/L (ref 3.5–5.1)
Sodium: 141 mmol/L (ref 135–145)

## 2019-08-26 LAB — CBC
HCT: 29.4 % — ABNORMAL LOW (ref 36.0–46.0)
Hemoglobin: 9.4 g/dL — ABNORMAL LOW (ref 12.0–15.0)
MCH: 28.1 pg (ref 26.0–34.0)
MCHC: 32 g/dL (ref 30.0–36.0)
MCV: 88 fL (ref 80.0–100.0)
Platelets: 157 10*3/uL (ref 150–400)
RBC: 3.34 MIL/uL — ABNORMAL LOW (ref 3.87–5.11)
RDW: 14.8 % (ref 11.5–15.5)
WBC: 12.7 10*3/uL — ABNORMAL HIGH (ref 4.0–10.5)
nRBC: 0 % (ref 0.0–0.2)

## 2019-08-26 LAB — SURGICAL PATHOLOGY

## 2019-08-26 MED ORDER — FE FUMARATE-B12-VIT C-FA-IFC PO CAPS
1.0000 | ORAL_CAPSULE | Freq: Two times a day (BID) | ORAL | Status: DC
  Administered 2019-08-26: 12:00:00 1 via ORAL
  Filled 2019-08-26 (×2): qty 1

## 2019-08-26 MED ORDER — FE FUMARATE-B12-VIT C-FA-IFC PO CAPS: 1.0000 | ORAL_CAPSULE | Freq: Two times a day (BID) | ORAL | 0 refills | Status: AC

## 2019-08-26 NOTE — Progress Notes (Signed)
Discharge instructions reviewed with patient. She verbalized understanding. IV removed. Stable condition. Patient was wheeled out by NT where her significant other was waiting on her.

## 2019-08-26 NOTE — Progress Notes (Signed)
   Subjective: 1 Day Post-Op Procedure(s) (LRB): RIGHT TOTAL HIP ARTHROPLASTY ANTERIOR APPROACH (Right) Patient reports pain as mild.   Patient is well, and has had no acute complaints or problems Denies any CP, SOB, ABD pain. We will continue therapy today.  Plan is to go Home after hospital stay.  Objective: Vital signs in last 24 hours: Temp:  [97.2 F (36.2 C)-99.2 F (37.3 C)] 97.9 F (36.6 C) (01/29 0808) Pulse Rate:  [58-82] 64 (01/29 0808) Resp:  [11-20] 20 (01/29 0808) BP: (107-160)/(71-97) 133/84 (01/29 0808) SpO2:  [92 %-100 %] 99 % (01/29 0808)  Intake/Output from previous day: 01/28 0701 - 01/29 0700 In: 3358.2 [P.O.:480; I.V.:2278.2; IV Piggyback:600] Out: 300 [Blood:300] Intake/Output this shift: No intake/output data recorded.  Recent Labs    08/23/19 1309 08/26/19 0335  HGB 13.1 9.4*   Recent Labs    08/23/19 1309 08/26/19 0335  WBC 6.6 12.7*  RBC 4.64 3.34*  HCT 40.1 29.4*  PLT 219 157   Recent Labs    08/23/19 1309 08/26/19 0335  NA 138 141  K 3.9 3.6  CL 103 108  CO2 26 30  BUN 21* 16  CREATININE 0.83 0.81  GLUCOSE 96 147*  CALCIUM 9.4 8.6*   Recent Labs    08/23/19 1309  INR 1.0    EXAM General - Patient is Alert, Appropriate and Oriented Extremity - Neurovascular intact Sensation intact distally Intact pulses distally Dorsiflexion/Plantar flexion intact No cellulitis present Compartment soft Dressing - dressing C/D/I and no drainage Motor Function - intact, moving foot and toes well on exam.   Past Medical History:  Diagnosis Date  . Anxiety   . Arthritis   . Asthma   . Chronic hepatitis (HCC)    12 wk [place] Tx 2019  . Cirrhosis (HCC)   . Constipation   . Depression   . GERD (gastroesophageal reflux disease)   . High cholesterol   . Hypertension   . Prediabetes     Assessment/Plan:   1 Day Post-Op Procedure(s) (LRB): RIGHT TOTAL HIP ARTHROPLASTY ANTERIOR APPROACH (Right) Active Problems:   Status post  total hip replacement, right   Observation after surgery  Estimated body mass index is 27.28 kg/m as calculated from the following:   Height as of 08/18/19: 5\' 6"  (1.676 m).   Weight as of 08/18/19: 76.7 kg. Advance diet Up with therapy  Labs and vital signs stable Pain well controlled Care management to assist with discharge to home with home health PT hopefully today if good progress with physical therapy.  DVT Prophylaxis - Lovenox, Foot Pumps and TED hose Weight-Bearing as tolerated to right leg   T. 08/20/19, PA-C [facility] 08/26/2019, 8:21 AM
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
## 2019-08-26 NOTE — Progress Notes (Signed)
Physical Therapy Treatment Patient Details Name: Donna Phelps MRN: [id number] DOB: 1961-07-28 Today's Date: 08/26/2019    History of Present Illness Pt is a 59 yo female diagnosed with OA of the R hip and is s/p elective R THA. PMH includes: asthma, smoker, anxiety, hep-c, depression, GERD, and HTN.    PT Comments    Pt motivated to participate during the session and reported feeling safe and ready to return home.  Pt demonstrated improved functional R hip flex strength this session with improved RLE swing through clearance and improved cadence with amb.  Pt steady with tranfers and ambulation with good carryover regarding proper sequencing for safety.  Pt reported no adverse symptoms during the session other than R hip pain.which was improved compared to the AM session.  Pt will benefit from HHPT services upon discharge to safely address deficits listed in patient problem list for decreased caregiver assistance and eventual return to PLOF.   Follow Up Recommendations  Home health PT;Supervision for mobility/OOB     Equipment Recommendations  Rolling walker with 5" wheels;3in1 (PT)    Recommendations for Other Services       Precautions / Restrictions Precautions Precautions: Fall;Anterior Hip Precaution Booklet Issued: Yes (comment) Restrictions Weight Bearing Restrictions: Yes RLE Weight Bearing: Weight bearing as tolerated    Mobility  Bed Mobility Overal bed mobility: Modified Independent             General bed mobility comments: Extra time and effort but no physical assistance required  Transfers Overall transfer level: Needs assistance Equipment used: Rolling walker (2 wheeled) Transfers: Sit to/from Stand Sit to Stand: Supervision         General transfer comment: Good carryover with proper sequencing with good eccentric/concentric control during sit to/from stand transfers to various height surfaces  Ambulation/Gait Ambulation/Gait assistance:  Min guard Gait Distance (Feet): 150 Feet Assistive device: Rolling walker (2 wheeled) Gait Pattern/deviations: Step-to pattern;Decreased step length - left;Decreased stance time - right Gait velocity: decreased   General Gait Details: Pt continued to have difficulty advancing the RLE initially but improved quickly this session with grossly improved cadence and good stability   Stairs Stairs: Yes Stairs assistance: Min guard Stair Management: Two rails Number of Stairs: 2 General stair comments: Ud/down 2 steps x 3 with B rails and min verbal and visual cues for sequencing with pt presenting with good eccentric and concentric control   Wheelchair Mobility    Modified Rankin (Stroke Patients Only)       Balance Overall balance assessment: Needs assistance Sitting-balance support: Feet supported;Single extremity supported Sitting balance-Leahy Scale: Good     Standing balance support: Bilateral upper extremity supported Standing balance-Leahy Scale: Fair Standing balance comment: Mod lean on the RW for support                            Cognition Arousal/Alertness: Awake/alert Behavior During Therapy: WFL for tasks assessed/performed Overall Cognitive Status: Within Functional Limits for tasks assessed                                        Exercises Total Joint Exercises Ankle Circles/Pumps: AROM;Strengthening;Both;10 reps Quad Sets: Strengthening;Both;10 reps Gluteal Sets: Strengthening;Both;10 reps Heel Slides: AROM;Both;10 reps Hip ABduction/ADduction: AAROM;Right;10 reps Straight Leg Raises: AAROM;Right;10 reps Long Arc Quad: AROM;Strengthening;Both;10 reps Knee Flexion: AROM;Strengthening;Both;10 reps Marching in Standing: AROM;Both;10 reps;Standing  Other Exercises Other Exercises: HEP education/review per handout    General Comments        Pertinent Vitals/Pain Pain Assessment: 0-10 Pain Score: 8  Pain Location: R hip Pain  Descriptors / Indicators: Aching;Sore Pain Intervention(s): Premedicated before session;Monitored during session    Home Living                      Prior Function            PT Goals (current goals can now be found in the care plan section) Progress towards PT goals: Progressing toward goals    Frequency    BID      PT Plan Current plan remains appropriate    Co-evaluation              AM-PAC PT "6 Clicks" Mobility   Outcome Measure  Help needed turning from your back to your side while in a flat bed without using bedrails?: A Little Help needed moving from lying on your back to sitting on the side of a flat bed without using bedrails?: A Little Help needed moving to and from a bed to a chair (including a wheelchair)?: A Little Help needed standing up from a chair using your arms (e.g., wheelchair or bedside chair)?: A Little Help needed to walk in hospital room?: A Little Help needed climbing 3-5 steps with a railing? : A Little 6 Click Score: 18    End of Session Equipment Utilized During Treatment: Gait belt Activity Tolerance: Patient tolerated treatment well Patient left: in chair;with chair alarm set;with call bell/phone within reach Nurse Communication: Mobility status PT Visit Diagnosis: Other abnormalities of gait and mobility (R26.89);Muscle weakness (generalized) (M62.81);Pain Pain - Right/Left: Right Pain - part of body: Hip     Time: 6967-8938 PT Time Calculation (min) (ACUTE ONLY): 25 min  Charges:  $Gait Training: 8-22 mins $Therapeutic Exercise: 8-22 mins                     D. Scott Ivon Roedel PT, DPT 08/26/19, 2:43 PM

## 2019-08-26 NOTE — Discharge Summary (Signed)
Physician Discharge Summary  Patient ID: Donna Phelps MRN: [id number] DOB/AGE: 1961/03/28 59 y.o.  Admit date: 08/25/2019 Discharge date: 08/26/2019  Admission Diagnoses:  Status post total hip replacement, right [Z96.641] Observation after surgery [Z48.89]   Discharge Diagnoses: Patient Active Problem List   Diagnosis Date Noted  . 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 Observation after surgery 08/25/2019    Past Medical History:  Diagnosis Date  . Anxiety   . Arthritis   . Asthma   . Chronic hepatitis (HCC)    12 wk [place] Tx 2019  . Cirrhosis (HCC)   . Constipation   . Depression   . GERD (gastroesophageal reflux disease)   . High cholesterol   . Hypertension   . Prediabetes      Transfusion: none   Consultants (if any):   Discharged Condition: Improved  Hospital Course: Donna Phelps is an 59 y.o. female who was admitted 08/25/2019 with a diagnosis of right hip osteoarthritisand went to the operating room on 08/25/2019 and underwent the above named procedures.    Surgeries: Procedure(s): RIGHT TOTAL HIP ARTHROPLASTY ANTERIOR APPROACH on 08/25/2019 Patient tolerated the surgery well. Taken to PACU where she was stabilized and then transferred to the orthopedic floor.  Started on Lovenox 40 mg q 24 hrs. Foot pumps applied bilaterally at 80 mm. Heels elevated on bed with rolled towels. No evidence of DVT. Negative Homan. Physical therapy started on day #1 for gait training and transfer. OT started day #1 for ADL and assisted devices.  Patient's foley was d/c on day #1. Patient's IV was d/c on day #1.  On post op day #1 patient was stable and ready for discharge to home with HHPT.  Implants: Medacta AMIS standard 2, 52 mm Mpact DM cup and liner with ceramic S 28 mm head  She was given perioperative antibiotics:  Anti-infectives (From admission, onward)   Start     Dose/Rate Route Frequency Ordered Stop   08/25/19 1758  ceFAZolin  (ANCEF) 2-4 GM/100ML-% IVPB    Note to Pharmacy: Dionisio Paschal   : cabinet override      08/25/19 1758 08/26/19 0614   08/25/19 1600  ceFAZolin (ANCEF) IVPB 2g/100 mL premix     2 g 200 mL/hr over 30 Minutes Intravenous Every 6 hours 08/25/19 1154 08/26/19 0602   08/25/19 0711  ceFAZolin (ANCEF) 2-4 GM/100ML-% IVPB    Note to Pharmacy: Manfred Arch   : cabinet override      08/25/19 0711 08/25/19 1842   08/25/19 0600  ceFAZolin (ANCEF) IVPB 2g/100 mL premix     2 g 200 mL/hr over 30 Minutes Intravenous On call to O.R. 08/25/19 0031 08/25/19 1026   08/25/19 0000  amoxicillin-clavulanate (AUGMENTIN) 875-125 MG tablet     1 tablet Oral 2 times daily 08/25/19 1141 08/30/19 2359    .  She was given sequential compression devices, early ambulation, and Lovenox TEDs for DVT prophylaxis.  She benefited maximally from the hospital stay and there were no complications.    Recent vital signs:  Vitals:   08/26/19 0308 08/26/19 0808  BP: 128/74 133/84  Pulse: 69 64  Resp: 16 20  Temp: 98.2 F (36.8 C) 97.9 F (36.6 C)  SpO2: 98% 99%    Recent laboratory studies:  Lab Results  Component Value Date   HGB 9.4 (L) 08/26/2019   HGB 13.1 08/23/2019   HGB 13.8 05/10/2016   Lab Results  Component Value Date   WBC  12.7 (H) 08/26/2019   PLT 157 08/26/2019   Lab Results  Component Value Date   INR 1.0 08/23/2019   Lab Results  Component Value Date   NA 141 08/26/2019   K 3.6 08/26/2019   CL 108 08/26/2019   CO2 30 08/26/2019   BUN 16 08/26/2019   CREATININE 0.81 08/26/2019   GLUCOSE 147 (H) 08/26/2019    Discharge Medications:   Allergies as of 08/26/2019   No Known Allergies     Medication List    STOP taking these medications   naproxen 500 MG tablet Commonly known as: NAPROSYN     TAKE these medications   albuterol 108 (90 Base) MCG/ACT inhaler Commonly known as: VENTOLIN HFA Inhale 2 puffs into the lungs every 6 (six) hours as needed for wheezing or  shortness of breath.   amoxicillin-clavulanate 875-125 MG tablet Commonly known as: Augmentin Take 1 tablet by mouth 2 (two) times daily for 5 days.   enoxaparin 40 MG/0.4ML injection Commonly known as: LOVENOX Inject 0.4 mLs (40 mg total) into the skin daily for 10 days.   ferrous [id number] C-folic acid capsule Commonly known as: TRINSICON / FOLTRIN Take 1 capsule by mouth 2 (two) times daily after a meal.   lisinopril-hydrochlorothiazide 20-12.5 MG tablet Commonly known as: ZESTORETIC Take 1 tablet by mouth daily.   omeprazole 40 MG capsule Commonly known as: PRILOSEC Take 40 mg by mouth 2 (two) times daily.   oxyCODONE 5 MG immediate release tablet Commonly known as: Roxicodone Take 1-2 tablets (5-10 mg total) by mouth every 4 (four) hours as needed for severe pain.   traMADol 50 MG tablet Commonly known as: Ultram Take 1 tablet (50 mg total) by mouth 4 (four) times daily.            Durable Medical Equipment  (From admission, onward)         Start     Ordered   08/25/19 1154  DME Bedside commode  Once    Question:  Patient needs a bedside commode to treat with the following condition  Answer:  Status post total hip replacement, right   08/25/19 1153   08/25/19 1154  DME Walker rolling  Once    Question Answer Comment  Walker: With 5 Inch Wheels   Patient needs a walker to treat with the following condition Status post total hip replacement, right      08/25/19 1153   08/25/19 1154  DME 3 n 1  Once     08/25/19 1153          Diagnostic Studies: DG HIP OPERATIVE UNILAT W OR W/O PELVIS RIGHT  Result Date: 08/25/2019 CLINICAL DATA:  RIGHT hip replacement EXAM: OPERATIVE RIGHT HIP (WITH PELVIS IF PERFORMED) 3 VIEWS TECHNIQUE: Fluoroscopic spot image(s) were submitted for interpretation post-operatively. COMPARISON:  08/20/2018 FLUOROSCOPY TIME:  0 minutes 42 seconds Dose: 13.1 mGy Images: 3 FINDINGS: Advanced osteoarthritic changes of the RIGHT hip  joint with osseous demineralization. Images demonstrate placement of a RIGHT hip prosthesis without fracture or dislocation. Tip of the femoral component is not imaged. IMPRESSION: RIGHT hip prosthesis without acute complication as above. Electronically Signed   By: Lavonia Dana M.D.   On: 08/25/2019 11:28   DG HIP UNILAT W OR W/O PELVIS 2-3 VIEWS RIGHT  Result Date: 08/25/2019 CLINICAL DATA:  Primary osteoarthritis of the right hip. EXAM: DG HIP (WITH OR WITHOUT PELVIS) 2-3V RIGHT COMPARISON:  Radiographs dated 08/20/2018 FINDINGS: There is lucency between the acetabular  component and the re-contoured right acetabulum. The femoral component appears in good position. No fractures. Expected soft tissue postsurgical changes. IMPRESSION: Lucency between the acetabular component and the acetabulum as described. Electronically Signed   By: Francene Boyers M.D.   On: 08/25/2019 12:42    Disposition: Discharge disposition: 01-Home or Self Care         Follow-up Information    Kennedy Bucker, MD Follow up on 09/09/2019.   Specialty: Orthopedic Surgery Why: For suture removal at 1415 Contact information: [address] [facility] [place] [address] [phone]            Signed: Patience Musca 08/26/2019, 1:00 PM

## 2019-08-26 NOTE — Progress Notes (Signed)
Physical Therapy Treatment Patient Details Name: Donna Phelps MRN: [id number] DOB: [birth year] Today's Date: 08/26/2019    History of Present Illness Pt is a 59 yo female diagnosed with OA of the R hip and is s/p elective R THA. PMH includes: asthma, smoker, anxiety, hep-c, depression, GERD, and HTN.    PT Comments    Pt reported 10/10 R hip pain at onset of session but was in no apparent distress and stated that she did not need nursing to be called for possible pain medication.  Pt presented with no dizziness this session or any other adverse symptoms other than R hip pain.  Pt had significant difficulty advancing her RLE during gait and initially scooted her R foot forward on the floor slowly using toe flex to assist.  Pt gradually progressed to where she could advance the RLE without good clearance but gait remained very slow and effortful.  Pt was able to eventually amb 150' and then ascend/descend multiple steps with good control and stability.  Pt will benefit from HHPT services upon discharge to safely address deficits listed in patient problem list for decreased caregiver assistance and eventual return to PLOF.   Follow Up Recommendations  Home health PT;Supervision for mobility/OOB     Equipment Recommendations  Rolling walker with 5" wheels;3in1 (PT)    Recommendations for Other Services       Precautions / Restrictions Precautions Precautions: Fall;Anterior Hip Restrictions Weight Bearing Restrictions: Yes RLE Weight Bearing: Weight bearing as tolerated    Mobility  Bed Mobility Overal bed mobility: Modified Independent             General bed mobility comments: Extra time and effort but no physical assistance required  Transfers Overall transfer level: Needs assistance Equipment used: Rolling walker (2 wheeled) Transfers: Sit to/from Stand Sit to Stand: Min guard         General transfer comment: Mod verbal and tactile cues for  sequencing  Ambulation/Gait Ambulation/Gait assistance: Min guard Gait Distance (Feet): 150 Feet Assistive device: Rolling walker (2 wheeled) Gait Pattern/deviations: Step-to pattern;Decreased step length - left;Decreased stance time - right Gait velocity: decreased   General Gait Details: Pt had significant difficulty advancing the RLE and initially used toe flex to slowly scoot her R foot forward but improved during the session to where she was able to clear the floor while advancing the RLE   Stairs Stairs: Yes Stairs assistance: Min guard Stair Management: Two rails Number of Stairs: 2 General stair comments: Ud/down 2 steps x 3 with B rails and min verbal and visual cues for sequencing with pt presenting with good eccentric and concentric control   Wheelchair Mobility    Modified Rankin (Stroke Patients Only)       Balance Overall balance assessment: Needs assistance Sitting-balance support: Feet supported;Single extremity supported Sitting balance-Leahy Scale: Good     Standing balance support: Bilateral upper extremity supported Standing balance-Leahy Scale: Fair Standing balance comment: Mod lean on the RW for support                            Cognition Arousal/Alertness: Awake/alert Behavior During Therapy: WFL for tasks assessed/performed Overall Cognitive Status: Within Functional Limits for tasks assessed                                        Exercises Total  Joint Exercises Ankle Circles/Pumps: AROM;Strengthening;Both;10 reps Quad Sets: Strengthening;Both;10 reps Gluteal Sets: Strengthening;Both;10 reps Heel Slides: AROM;Both;10 reps Hip ABduction/ADduction: AAROM;Right;10 reps;15 reps Straight Leg Raises: AAROM;Right;10 reps;15 reps Long Arc Quad: AROM;Strengthening;Both;10 reps;15 reps Knee Flexion: AROM;Strengthening;Both;15 reps;10 reps Marching in Standing: AROM;Both;10 reps;Standing Other Exercises Other  Exercises: HEP education/review per handout    General Comments        Pertinent Vitals/Pain Pain Assessment: 0-10 Pain Score: 10-Worst pain ever Pain Location: R hip Pain Descriptors / Indicators: Aching;Sore Pain Intervention(s): Premedicated before session;Monitored during session;Other (comment)(Pt stated she did not need nursing to provide additional pain medication)    Home Living                      Prior Function            PT Goals (current goals can now be found in the care plan section) Progress towards PT goals: Progressing toward goals    Frequency    BID      PT Plan Current plan remains appropriate    Co-evaluation              AM-PAC PT "6 Clicks" Mobility   Outcome Measure  Help needed turning from your back to your side while in a flat bed without using bedrails?: A Little Help needed moving from lying on your back to sitting on the side of a flat bed without using bedrails?: A Little Help needed moving to and from a bed to a chair (including a wheelchair)?: A Little Help needed standing up from a chair using your arms (e.g., wheelchair or bedside chair)?: A Little Help needed to walk in hospital room?: A Little Help needed climbing 3-5 steps with a railing? : A Little 6 Click Score: 18    End of Session Equipment Utilized During Treatment: Gait belt Activity Tolerance: Patient tolerated treatment well Patient left: in chair;with nursing/sitter in room;with chair alarm set;with call bell/phone within reach Nurse Communication: Mobility status PT Visit Diagnosis: Other abnormalities of gait and mobility (R26.89);Muscle weakness (generalized) (M62.81);Pain Pain - Right/Left: Right Pain - part of body: Hip     Time: 1000-1039 PT Time Calculation (min) (ACUTE ONLY): 39 min  Charges:  $Gait Training: 23-37 mins $Therapeutic Exercise: 8-22 mins                     D. Scott Sidrah Harden PT, DPT 08/26/19, 12:24 PM

## 2019-08-26 NOTE — Anesthesia Postprocedure Evaluation (Signed)
Anesthesia Post Note  Patient: Marieelena Bartko  Procedure(s) Performed: RIGHT TOTAL HIP ARTHROPLASTY ANTERIOR APPROACH (Right Hip)  Patient location during evaluation: PACU Anesthesia Type: Spinal and General Level of consciousness: awake and alert Pain management: satisfactory to patient Vital Signs Assessment: post-procedure vital signs reviewed and stable Respiratory status: spontaneous breathing, nonlabored ventilation and respiratory function stable Cardiovascular status: stable and blood pressure returned to baseline Postop Assessment: no apparent nausea or vomiting and no backache Anesthetic complications: no     Last Vitals:  Vitals:   08/25/19 2255 08/26/19 0308  BP: 110/71 128/74  Pulse: 73 69  Resp: 16 16  Temp: 36.8 C 36.8 C  SpO2: 95% 98%    Last Pain:  Vitals:   08/25/19 2345  TempSrc:   PainSc: Asleep                 Lynden Oxford

## 2020-09-05 ENCOUNTER — Other Ambulatory Visit: Payer: Self-pay

## 2020-09-05 ENCOUNTER — Emergency Department
Admission: EM | Admit: 2020-09-05 | Discharge: 2020-09-06 | Disposition: A | Discharge status: Other Acute Inpatient Hospital | Payer: Medicaid Other | Attending: Emergency Medicine | Admitting: Emergency Medicine

## 2020-09-05 ENCOUNTER — Emergency Department: Payer: Medicaid Other

## 2020-09-05 ENCOUNTER — Inpatient Hospital Stay: Admit: 2020-09-05 | Payer: Medicaid Other | Admitting: Family Medicine

## 2020-09-05 DIAGNOSIS — J45909 Unspecified asthma, uncomplicated: Secondary | ICD-10-CM | POA: Insufficient documentation

## 2020-09-05 DIAGNOSIS — R519 Headache, unspecified: Secondary | ICD-10-CM | POA: Diagnosis not present

## 2020-09-05 DIAGNOSIS — Z79899 Other long term (current) drug therapy: Secondary | ICD-10-CM | POA: Insufficient documentation

## 2020-09-05 DIAGNOSIS — J869 Pyothorax without fistula: Secondary | ICD-10-CM | POA: Diagnosis not present

## 2020-09-05 DIAGNOSIS — Z96641 Presence of right artificial hip joint: Secondary | ICD-10-CM | POA: Insufficient documentation

## 2020-09-05 DIAGNOSIS — F17210 Nicotine dependence, cigarettes, uncomplicated: Secondary | ICD-10-CM | POA: Diagnosis not present

## 2020-09-05 DIAGNOSIS — Z20822 Contact with and (suspected) exposure to covid-19: Secondary | ICD-10-CM | POA: Insufficient documentation

## 2020-09-05 DIAGNOSIS — I10 Essential (primary) hypertension: Secondary | ICD-10-CM | POA: Insufficient documentation

## 2020-09-05 DIAGNOSIS — R0602 Shortness of breath: Secondary | ICD-10-CM | POA: Diagnosis present

## 2020-09-05 LAB — BASIC METABOLIC PANEL
Anion gap: 13 (ref 5–15)
BUN: 16 mg/dL (ref 8–23)
CO2: 24 mmol/L (ref 22–32)
Calcium: 8.9 mg/dL (ref 8.9–10.3)
Chloride: 98 mmol/L (ref 98–111)
Creatinine, Ser: 0.67 mg/dL (ref 0.44–1.00)
GFR, Estimated: >60 mL/min (ref >60)
Glucose, Bld: 114 mg/dL — ABNORMAL HIGH (ref 70–99)
Potassium: 3.4 mmol/L — ABNORMAL LOW (ref 3.5–5.1)
Sodium: 135 mmol/L (ref 135–145)

## 2020-09-05 LAB — CBC
HCT: 39.2 % (ref 36.0–46.0)
Hemoglobin: 12.6 g/dL (ref 12.0–15.0)
MCH: 27.5 pg (ref 26.0–34.0)
MCHC: 32.1 g/dL (ref 30.0–36.0)
MCV: 85.4 fL (ref 80.0–100.0)
Platelets: 242 10*3/uL (ref 150–400)
RBC: 4.59 MIL/uL (ref 3.87–5.11)
RDW: 15.9 % — ABNORMAL HIGH (ref 11.5–15.5)
WBC: 17.9 10*3/uL — ABNORMAL HIGH (ref 4.0–10.5)
nRBC: 0 % (ref 0.0–0.2)

## 2020-09-05 LAB — TROPONIN I (HIGH SENSITIVITY)
Troponin I (High Sensitivity): 6 ng/L (ref <18)
Troponin I (High Sensitivity): 7 ng/L (ref <18)

## 2020-09-05 LAB — RESP PANEL BY RT-PCR (FLU A&B, COVID) ARPGX2
Influenza A by PCR: NEGATIVE
Influenza B by PCR: NEGATIVE
SARS Coronavirus 2 by RT PCR: NEGATIVE

## 2020-09-05 LAB — LACTIC ACID, PLASMA: Lactic Acid, Venous: 1.1 mmol/L (ref 0.5–1.9)

## 2020-09-05 MED ORDER — LISINOPRIL-HYDROCHLOROTHIAZIDE 20-12.5 MG PO TABS: 1.0000 | ORAL_TABLET | Freq: Every day | ORAL | Status: DC

## 2020-09-05 MED ORDER — SODIUM CHLORIDE 0.9 % IV SOLN
2.0000 g | INTRAVENOUS | Status: DC
  Administered 2020-09-05: 2 g via INTRAVENOUS
  Filled 2020-09-05: qty 20

## 2020-09-05 MED ORDER — METRONIDAZOLE IN NACL 5-0.79 MG/ML-% IV SOLN
500.0000 mg | Freq: Once | INTRAVENOUS | Status: AC
  Administered 2020-09-05: 500 mg via INTRAVENOUS
  Filled 2020-09-05: qty 100

## 2020-09-05 MED ORDER — FE FUMARATE-B12-VIT C-FA-IFC PO CAPS
1.0000 | ORAL_CAPSULE | Freq: Two times a day (BID) | ORAL | Status: DC
  Filled 2020-09-05: qty 1

## 2020-09-05 MED ORDER — ACETAMINOPHEN 500 MG PO TABS
1000.0000 mg | ORAL_TABLET | Freq: Once | ORAL | Status: AC
  Administered 2020-09-05: 1000 mg via ORAL
  Filled 2020-09-05: qty 2

## 2020-09-05 MED ORDER — IOHEXOL 300 MG/ML  SOLN
75.0000 mL | Freq: Once | INTRAMUSCULAR | Status: AC | PRN
  Administered 2020-09-05: 75 mL via INTRAVENOUS

## 2020-09-05 MED ORDER — MORPHINE SULFATE (PF) 4 MG/ML IV SOLN
4.0000 mg | INTRAVENOUS | Status: DC | PRN
  Administered 2020-09-05 – 2020-09-06 (×2): 4 mg via INTRAVENOUS
  Filled 2020-09-05 (×2): qty 1

## 2020-09-05 MED ORDER — METRONIDAZOLE 500 MG PO TABS
500.0000 mg | ORAL_TABLET | Freq: Three times a day (TID) | ORAL | Status: DC
  Administered 2020-09-06: 500 mg via ORAL
  Filled 2020-09-05: qty 1

## 2020-09-05 MED ORDER — SODIUM CHLORIDE 0.9 % IV BOLUS (SEPSIS)
1000.0000 mL | Freq: Once | INTRAVENOUS | Status: AC
  Administered 2020-09-05: 1000 mL via INTRAVENOUS

## 2020-09-05 MED ORDER — PANTOPRAZOLE SODIUM 40 MG PO TBEC: 40.0000 mg | DELAYED_RELEASE_TABLET | Freq: Every day | ORAL | Status: DC

## 2020-09-05 MED ORDER — SODIUM CHLORIDE 0.9 % IV SOLN
500.0000 mg | INTRAVENOUS | Status: DC
  Administered 2020-09-05: 500 mg via INTRAVENOUS
  Filled 2020-09-05: qty 500

## 2020-09-05 NOTE — ED Notes (Addendum)
Pt's clothing changed due to report of bedbugs. Pt refused removing hat, bras, and jeans. All other clothing tied in bags.  Pt provided meal tray per request.  Declined warm blankets.

## 2020-09-05 NOTE — ED Notes (Signed)
Pt to CT

## 2020-09-05 NOTE — ED Notes (Signed)
Oral T is 101.3. Informed EDP. Pt aware that bedbugs were found while in CT. IV still infusing. Pt has only 1 IV and R arm appears to have been stuck a few times in triage. Will administer zithromax once ceftriaxone finished.

## 2020-09-05 NOTE — ED Notes (Signed)
Pt to ED c/o SOB that started 5d ago when got covid booster shot. Pt anxious. States has CP but cannot describe it. EDP at bedside explaining xray findings to pt.

## 2020-09-05 NOTE — ED Provider Notes (Signed)
[facility] Emergency Department Provider Note  ____________________________________________  Time seen: Approximately 5:05 PM  I have reviewed the triage vital signs and the nursing notes.   HISTORY  Chief Complaint Shortness of Breath and Chest Pain    HPI Donna Phelps is a 60 y.o. female with a history of anxiety, depression, asthma, GERD, hypertension who comes the ED complaining of shortness of breath chest pain chills and generalized headache for the past 2 weeks.  Symptoms are waxing and waning, no aggravating or alleviating factors.  Chest pain is sharp, intermittent, not exertional, not pleuritic, not radiating, localizes to right anterior chest.  She does have nonproductive cough as well.      Past Medical History:  Diagnosis Date  . Anxiety   . Arthritis   . Asthma   . Chronic hepatitis (HCC)    12 wk [place] Tx 2019  . Cirrhosis (HCC)   . Constipation   . Depression   . GERD (gastroesophageal reflux disease)   . High cholesterol   . Hypertension   . Prediabetes      Patient Active Problem List   Diagnosis Date Noted  . 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 Observation after surgery 08/25/2019     Past Surgical History:  Procedure Laterality Date  . ABDOMINAL HYSTERECTOMY    . CATARACT EXTRACTION W/PHACO Right 12/31/2017   Procedure: CATARACT EXTRACTION PHACO AND INTRAOCULAR LENS PLACEMENT (IOC);  Surgeon: Nevada Crane, MD;  Location: [facility] ORS;  Service: Ophthalmology;  Laterality: Right;  fluid pack lot # [id number]   exp  08/27/2018 [place]   00:16.4 AP%   6.4 CDE    1.06   . CATARACT EXTRACTION W/PHACO Left 12/08/2018   Procedure: CATARACT EXTRACTION PHACO AND INTRAOCULAR LENS PLACEMENT (IOC) LEFT;  Surgeon: Nevada Crane, MD;  Location: [facility];  Service: Ophthalmology;  Laterality: Left;  . COLONOSCOPY WITH PROPOFOL N/A 04/06/2019   Procedure: COLONOSCOPY WITH PROPOFOL;  Surgeon: Toledo,  Boykin Nearing, MD;  Location: [facility] ENDOSCOPY;  Service: Gastroenterology;  Laterality: N/A;  . ESOPHAGOGASTRODUODENOSCOPY (EGD) WITH PROPOFOL N/A 03/24/2016   Procedure: ESOPHAGOGASTRODUODENOSCOPY (EGD) WITH PROPOFOL;  Surgeon: Scot Jun, MD;  Location: [facility] ENDOSCOPY;  Service: Endoscopy;  Laterality: N/A;  . ESOPHAGOGASTRODUODENOSCOPY (EGD) WITH PROPOFOL N/A 09/03/2016   Procedure: ESOPHAGOGASTRODUODENOSCOPY (EGD) WITH PROPOFOL;  Surgeon: Scot Jun, MD;  Location: [facility] ENDOSCOPY;  Service: Endoscopy;  Laterality: N/A;  . ESOPHAGOGASTRODUODENOSCOPY (EGD) WITH PROPOFOL N/A 04/06/2019   Procedure: ESOPHAGOGASTRODUODENOSCOPY (EGD) WITH PROPOFOL;  Surgeon: Toledo, Boykin Nearing, MD;  Location: [facility] ENDOSCOPY;  Service: Gastroenterology;  Laterality: N/A;  . TOTAL HIP ARTHROPLASTY Right 08/25/2019   Procedure: RIGHT TOTAL HIP ARTHROPLASTY ANTERIOR APPROACH;  Surgeon: Kennedy Bucker, MD;  Location: [facility];  Service: Orthopedics;  Laterality: Right;     Prior to Admission medications   Medication Sig Start Date End Date Taking? Authorizing Provider  albuterol (PROVENTIL HFA;VENTOLIN HFA) 108 (90 Base) MCG/ACT inhaler Inhale 2 puffs into the lungs every 6 (six) hours as needed for wheezing or shortness of breath.     [provider]  enoxaparin (LOVENOX) 40 MG/0.4ML injection Inject 0.4 mLs (40 mg total) into the skin daily for 10 days. 08/25/19 09/04/19  Kennedy Bucker, MD  ferrous fumarate-b12-vitamic C-folic acid (TRINSICON / FOLTRIN) capsule Take 1 capsule by mouth 2 (two) times daily after a meal. 08/26/19   Kennedy Bucker, MD  lisinopril-hydrochlorothiazide (PRINZIDE,ZESTORETIC) 20-12.5 MG tablet Take 1 tablet by mouth daily.  [provider]  omeprazole (PRILOSEC) 40 MG capsule Take 40 mg by mouth 2 (two) times daily.     [provider]  oxyCODONE (ROXICODONE) 5 MG immediate release tablet Take 1-2 tablets (5-10 mg total) by mouth every 4 (four) hours as needed for severe  pain. 08/25/19   Kennedy Bucker, MD     Allergies Patient has no known allergies.   History reviewed. No pertinent family history.  Social History Social History   Tobacco Use  . Smoking status: Current Some Day Smoker    Packs/day: 0.25    Years: 15.00    Pack years: 3.75    Types: Cigarettes  . Smokeless tobacco: Never Used  . Tobacco comment: 1 pack lasts about 1 month  Vaping Use  . Vaping Use: Never used  Substance Use Topics  . Alcohol use: Yes    Comment: occassional  . Drug use: No    Review of Systems  Constitutional:   No fever positive chills.  ENT:   No sore throat. No rhinorrhea. Cardiovascular: Positive chest pain without syncope. Respiratory: Positive shortness of breath and cough. Gastrointestinal:   Negative for abdominal pain, vomiting and diarrhea.  Musculoskeletal:   Negative for focal pain or swelling All other systems reviewed and are negative except as documented above in ROS and HPI.  ____________________________________________   PHYSICAL EXAM:  VITAL SIGNS: ED Triage Vitals [09/05/20 1306]  Enc Vitals Group     BP 122/71     Pulse Rate (!) 108     Resp (!) 25     Temp 100.3 F (37.9 C)     Temp Source Oral     SpO2 94 %     Weight 167 lb (75.8 kg)     Height 5\' 6"  (1.676 m)     Head Circumference      Peak Flow      Pain Score 10     Pain Loc      Pain Edu?      Excl. in GC?     Vital signs reviewed, nursing assessments reviewed.   Constitutional:   Alert and oriented. Non-toxic appearance. Eyes:   Conjunctivae are normal. EOMI. PERRL. ENT      Head:   Normocephalic and atraumatic.      Nose:   Wearing a mask.      Mouth/Throat:   Wearing a mask.      Neck:   No meningismus. Full ROM. Hematological/Lymphatic/Immunilogical:   No cervical lymphadenopathy. Cardiovascular:   Tachycardia heart rate 110. Symmetric bilateral radial and DP pulses.  No murmurs. Cap refill less than 2 seconds. Respiratory: Tachypnea,  respiratory rate 26.  Diminished breath sounds in the left middle and lower lung fields.  Right lung normal, no wheezing. Gastrointestinal:   Soft and nontender. Non distended. There is no CVA tenderness.  No rebound, rigidity, or guarding. Genitourinary:   deferred Musculoskeletal:   Normal range of motion in all extremities. No joint effusions.  No lower extremity tenderness.  No edema.  Patient represents exquisite tenderness of the right anterior chest with even minimal touch like placing a monitor electrode sticker. Neurologic:   Normal speech and language.  Motor grossly intact. No acute focal neurologic deficits are appreciated.  Skin:    Skin is warm, dry and intact. No rash noted.  No petechiae, purpura, or bullae.  ____________________________________________    LABS (pertinent positives/negatives) (all labs ordered are listed, but only abnormal results are displayed) Labs Reviewed  BASIC METABOLIC PANEL - Abnormal; Notable for the following components:      Result Value   Potassium 3.4 (*)    Glucose, Bld 114 (*)    All other components within normal limits  CBC - Abnormal; Notable for the following components:   WBC 17.9 (*)    RDW 15.9 (*)    All other components within normal limits  RESP PANEL BY RT-PCR (FLU A&B, COVID) ARPGX2  CULTURE, BLOOD (ROUTINE X 2)  CULTURE, BLOOD (ROUTINE X 2)  LACTIC ACID, PLASMA  TROPONIN I (HIGH SENSITIVITY)  TROPONIN I (HIGH SENSITIVITY)   ____________________________________________   EKG  Interpreted by me Sinus tachycardia with occasional premature supraventricular beats, rate of 107.  Rightward axis.  Normal intervals.  Normal QRS and ST segments.  Isolated T wave inversions in lead III.  Nonischemic appearance. ____________________________________________    RADIOLOGY  DG Chest 2 View  Result Date: 09/05/2020 CLINICAL DATA:  Chest pain and shortness of breath EXAM: CHEST - 2 VIEW COMPARISON:  April 24 2015 FINDINGS:  There is extensive opacity throughout much of the left lung with multiple areas of lobular opacity throughout the left upper lung peripherally and mid to lower lung region more centrally. Right lung is clear. The heart is borderline enlarged with pulmonary vascularity normal. No adenopathy. No bone lesions. IMPRESSION: Extensive opacity on the left. At least some of this opacity is felt to be due to loculated pleural effusion. There may well be areas of superimposed infiltrate on the left. Underlying mass lesions cannot be excluded given the lobular opacity in several areas on the left. Given this appearance of the left lung, correlation with contrast enhanced chest CT is felt to be advisable. Right lung is clear. Heart is borderline enlarged. No adenopathy evident by radiography. Electronically Signed   By: Bretta BangWilliam  Woodruff III M.D.   On: 09/05/2020 13:39   CT CHEST W CONTRAST  Result Date: 09/05/2020 CLINICAL DATA:  Shortness of breath. Chest pain. Headache. Abnormal radiography on the left. EXAM: CT CHEST WITH CONTRAST TECHNIQUE: Multidetector CT imaging of the chest was performed during intravenous contrast administration. CONTRAST:  75mL OMNIPAQUE IOHEXOL 300 MG/ML  SOLN COMPARISON:  Chest radiography earlier same day. Previous chest CT 04/02/2010. FINDINGS: Cardiovascular: Heart size is normal. Some coronary artery calcification is evident in the left system. Some aortic atherosclerotic calcification is present. Mediastinum/Nodes: Mild reactive nodal enlargement. Lungs/Pleura: The right lung is clear except for mild atelectasis at the posterior right base. There is a tiny amount of pleural fluid on the right, including a small amount within the major fissure. On the left, there is extensive loculated pleural fluid consistent with empyema. This is present along both the lateral and medial pleural spaces. Question if there could be some early extension into the mediastinum anteriorly. Infiltrate/collapse  affects the left lower lobe. Some volume loss in the left upper lobe as well. Upper Abdomen: Right renal cyst.  No acute upper abdominal finding. Musculoskeletal: Ordinary thoracic degenerative changes. IMPRESSION: 1. Extensive loculated pleural fluid on the left consistent with empyema. This is present along both the lateral and medial pleural spaces. Question if there could be some early extension into the mediastinum anteriorly. 2. Infiltrate/collapse affects the left lower lobe. Some volume loss in the left upper lobe as well. 3. Tiny amount of pleural fluid on the right, including a small amount within the major fissure. 4. Aortic atherosclerosis. Coronary artery calcification. Aortic Atherosclerosis (ICD10-I70.0). Electronically Signed   By: Scherrie BatemanMark  Shogry M.D.  On: 09/05/2020 17:21    ____________________________________________   PROCEDURES .Critical Care Performed by: Sharman Cheek, MD Authorized by: Sharman Cheek, MD   Critical care provider statement:    Critical care time (minutes):  35   Critical care time was exclusive of:  Separately billable procedures and treating other patients   Critical care was necessary to treat or prevent imminent or life-threatening deterioration of the following conditions:  Sepsis   Critical care was time spent personally by me on the following activities:  Development of treatment plan with patient or surrogate, discussions with consultants, evaluation of patient's response to treatment, examination of patient, obtaining history from patient or surrogate, ordering and performing treatments and interventions, ordering and review of laboratory studies, ordering and review of radiographic studies, pulse oximetry, re-evaluation of patient's condition and review of old charts    ____________________________________________  DIFFERENTIAL DIAGNOSIS   Pneumonia, pleural effusion, Covid, pneumothorax, sepsis  CLINICAL IMPRESSION / ASSESSMENT AND PLAN  / ED COURSE  Medications ordered in the ED: Medications  cefTRIAXone (ROCEPHIN) 2 g in sodium chloride 0.9 % 100 mL IVPB (0 g Intravenous Stopped 09/05/20 1805)  azithromycin (ZITHROMAX) 500 mg in sodium chloride 0.9 % 250 mL IVPB (500 mg Intravenous New Bag/Given 09/05/20 1808)  metroNIDAZOLE (FLAGYL) IVPB 500 mg (has no administration in time range)  sodium chloride 0.9 % bolus 1,000 mL (0 mLs Intravenous Stopped 09/05/20 1846)  iohexol (OMNIPAQUE) 300 MG/ML solution 75 mL (75 mLs Intravenous Contrast Given 09/05/20 1658)  acetaminophen (TYLENOL) tablet 1,000 mg (1,000 mg Oral Given 09/05/20 1821)    Pertinent labs & imaging results that were available during my care of the patient were reviewed by me and considered in my medical decision making (see chart for details).  Harika Laidlaw was evaluated in Emergency Department on 09/05/2020 for the symptoms described in the history of present illness. She was evaluated in the context of the global COVID-19 pandemic, which necessitated consideration that the patient might be at risk for infection with the SARS-CoV-2 virus that causes COVID-19. Institutional protocols and algorithms that pertain to the evaluation of patients at risk for COVID-19 are in a state of rapid change based on information released by regulatory bodies including the CDC and federal and state organizations. These policies and algorithms were followed during the patient's care in the ED.   Patient presents with shortness of breath cough chills for the last 2 weeks.  Vital signs show borderline fever, tachycardia, tachypnea.  Not hypoxic, 94% on room air but does feel more comfortable on supplemental O2.  Chest x-ray viewed and interpreted by me, shows left lower lung dense opacity, though lateral view is less impressive.  No pneumothorax.  Radiology report reviewed, agree with recommendation for CT scan and will obtain.  Doubt ACS PE dissection.  Due to symptoms, extent of lung  involvement, abnormal vital signs, would like to hospitalize patient for initial treatment to ensure improvement.  Clinical Course as of 09/05/20 2011  Wed Sep 05, 2020  1910 CT scan demonstrates empyema with possible extension into mediastinum.  Discussed with thoracic surgery Dr. Inez Catalina who recommends transfer due to possible mediastinal involvement.  Patient has already received Rocephin, will add Flagyl for anaerobic coverage. [PS]    Clinical Course User Index [PS] Sharman Cheek, MD     ----------------------------------------- 8:10 PM on 09/05/2020 -----------------------------------------  Discussed with [facility] thoracic surgery Dr. Laneta Simmers who can consult on patient once arrived to hospital service.  Discussed with hospitalist Dr. Antionette Char  who accepts for transfer.  ____________________________________________   FINAL CLINICAL IMPRESSION(S) / ED DIAGNOSES    Final diagnoses:  Empyema of left pleural space [facility])     ED Discharge Orders    None      Portions of this note were generated with dragon dictation software. Dictation errors may occur despite best attempts at proofreading.   Sharman Cheek, MD 09/05/20 2011

## 2020-09-05 NOTE — ED Notes (Addendum)
Son at bedside. EDP at bedside discussing plan of care.

## 2020-09-05 NOTE — ED Notes (Signed)
CT called. Pt has bedbugs. CN notified. Towel placed at door entrance.

## 2020-09-05 NOTE — ED Notes (Addendum)
Awaiting bed assignment for transfer; pt aware.

## 2020-09-05 NOTE — ED Triage Notes (Signed)
Pt comes pov with sob, chest pain, chills, headache for about 2 weeks. States that she got her booster on Friday.

## 2020-09-05 NOTE — ED Notes (Signed)
Contacted EDP re: whether pt can have diet order. Pt requesting to eat.

## 2020-09-05 NOTE — ED Notes (Signed)
Pt had tylenol at 12pm

## 2020-09-06 ENCOUNTER — Encounter (HOSPITAL_COMMUNITY): Payer: Self-pay | Admitting: Family Medicine

## 2020-09-06 ENCOUNTER — Other Ambulatory Visit: Payer: Self-pay

## 2020-09-06 ENCOUNTER — Inpatient Hospital Stay (HOSPITAL_COMMUNITY)
Admission: AD | Admit: 2020-09-06 | Discharge: 2020-09-12 | DRG: 853 | Disposition: A | Discharge status: Home Health | Payer: Medicaid Other | Source: Other Acute Inpatient Hospital | Attending: Internal Medicine | Admitting: Internal Medicine

## 2020-09-06 DIAGNOSIS — Z8619 Personal history of other infectious and parasitic diseases: Secondary | ICD-10-CM | POA: Diagnosis not present

## 2020-09-06 DIAGNOSIS — I10 Essential (primary) hypertension: Secondary | ICD-10-CM | POA: Diagnosis present

## 2020-09-06 DIAGNOSIS — E8809 Other disorders of plasma-protein metabolism, not elsewhere classified: Secondary | ICD-10-CM | POA: Diagnosis present

## 2020-09-06 DIAGNOSIS — I493 Ventricular premature depolarization: Secondary | ICD-10-CM | POA: Diagnosis present

## 2020-09-06 DIAGNOSIS — Z90710 Acquired absence of both cervix and uterus: Secondary | ICD-10-CM

## 2020-09-06 DIAGNOSIS — E876 Hypokalemia: Secondary | ICD-10-CM | POA: Diagnosis present

## 2020-09-06 DIAGNOSIS — F17210 Nicotine dependence, cigarettes, uncomplicated: Secondary | ICD-10-CM | POA: Diagnosis present

## 2020-09-06 DIAGNOSIS — J869 Pyothorax without fistula: Secondary | ICD-10-CM | POA: Diagnosis present

## 2020-09-06 DIAGNOSIS — J189 Pneumonia, unspecified organism: Secondary | ICD-10-CM | POA: Diagnosis present

## 2020-09-06 DIAGNOSIS — K0889 Other specified disorders of teeth and supporting structures: Secondary | ICD-10-CM | POA: Diagnosis present

## 2020-09-06 DIAGNOSIS — F1010 Alcohol abuse, uncomplicated: Secondary | ICD-10-CM | POA: Diagnosis present

## 2020-09-06 DIAGNOSIS — Z96641 Presence of right artificial hip joint: Secondary | ICD-10-CM | POA: Diagnosis present

## 2020-09-06 DIAGNOSIS — A419 Sepsis, unspecified organism: Secondary | ICD-10-CM | POA: Diagnosis present

## 2020-09-06 DIAGNOSIS — K7460 Unspecified cirrhosis of liver: Secondary | ICD-10-CM | POA: Diagnosis present

## 2020-09-06 DIAGNOSIS — K219 Gastro-esophageal reflux disease without esophagitis: Secondary | ICD-10-CM | POA: Diagnosis present

## 2020-09-06 DIAGNOSIS — T502X5A Adverse effect of carbonic-anhydrase inhibitors, benzothiadiazides and other diuretics, initial encounter: Secondary | ICD-10-CM | POA: Diagnosis present

## 2020-09-06 DIAGNOSIS — Z79899 Other long term (current) drug therapy: Secondary | ICD-10-CM | POA: Diagnosis not present

## 2020-09-06 DIAGNOSIS — J4520 Mild intermittent asthma, uncomplicated: Secondary | ICD-10-CM | POA: Diagnosis present

## 2020-09-06 DIAGNOSIS — D62 Acute posthemorrhagic anemia: Secondary | ICD-10-CM | POA: Diagnosis not present

## 2020-09-06 DIAGNOSIS — J181 Lobar pneumonia, unspecified organism: Secondary | ICD-10-CM | POA: Diagnosis not present

## 2020-09-06 DIAGNOSIS — J939 Pneumothorax, unspecified: Secondary | ICD-10-CM

## 2020-09-06 DIAGNOSIS — Z9889 Other specified postprocedural states: Secondary | ICD-10-CM

## 2020-09-06 DIAGNOSIS — J90 Pleural effusion, not elsewhere classified: Secondary | ICD-10-CM | POA: Diagnosis present

## 2020-09-06 HISTORY — DX: Pneumonia, unspecified organism: J18.9

## 2020-09-06 LAB — CBC WITH DIFFERENTIAL/PLATELET
Abs Immature Granulocytes: 0.1 10*3/uL — ABNORMAL HIGH (ref 0.00–0.07)
Basophils Absolute: 0 10*3/uL (ref 0.0–0.1)
Basophils Relative: 0 %
Eosinophils Absolute: 0 10*3/uL (ref 0.0–0.5)
Eosinophils Relative: 0 %
HCT: 36.8 % (ref 36.0–46.0)
Hemoglobin: 11.7 g/dL — ABNORMAL LOW (ref 12.0–15.0)
Immature Granulocytes: 1 %
Lymphocytes Relative: 10 %
Lymphs Abs: 1.7 10*3/uL (ref 0.7–4.0)
MCH: 27.1 pg (ref 26.0–34.0)
MCHC: 31.8 g/dL (ref 30.0–36.0)
MCV: 85.4 fL (ref 80.0–100.0)
Monocytes Absolute: 1.6 10*3/uL — ABNORMAL HIGH (ref 0.1–1.0)
Monocytes Relative: 10 %
Neutro Abs: 13.1 10*3/uL — ABNORMAL HIGH (ref 1.7–7.7)
Neutrophils Relative %: 79 %
Platelets: 225 10*3/uL (ref 150–400)
RBC: 4.31 MIL/uL (ref 3.87–5.11)
RDW: 15.6 % — ABNORMAL HIGH (ref 11.5–15.5)
WBC: 16.4 10*3/uL — ABNORMAL HIGH (ref 4.0–10.5)
nRBC: 0 % (ref 0.0–0.2)

## 2020-09-06 LAB — COMPREHENSIVE METABOLIC PANEL
ALT: 19 U/L (ref 0–44)
AST: 22 U/L (ref 15–41)
Albumin: 2.6 g/dL — ABNORMAL LOW (ref 3.5–5.0)
Alkaline Phosphatase: 61 U/L (ref 38–126)
Anion gap: 10 (ref 5–15)
BUN: 11 mg/dL (ref 8–23)
CO2: 26 mmol/L (ref 22–32)
Calcium: 8.4 mg/dL — ABNORMAL LOW (ref 8.9–10.3)
Chloride: 101 mmol/L (ref 98–111)
Creatinine, Ser: 0.74 mg/dL (ref 0.44–1.00)
GFR, Estimated: >60 mL/min (ref >60)
Glucose, Bld: 113 mg/dL — ABNORMAL HIGH (ref 70–99)
Potassium: 3.2 mmol/L — ABNORMAL LOW (ref 3.5–5.1)
Sodium: 137 mmol/L (ref 135–145)
Total Bilirubin: 0.5 mg/dL (ref 0.3–1.2)
Total Protein: 6.9 g/dL (ref 6.5–8.1)

## 2020-09-06 LAB — MAGNESIUM: Magnesium: 1.7 mg/dL (ref 1.7–2.4)

## 2020-09-06 LAB — HIV ANTIBODY (ROUTINE TESTING W REFLEX): HIV Screen 4th Generation wRfx: NONREACTIVE

## 2020-09-06 MED ORDER — IPRATROPIUM-ALBUTEROL 0.5-2.5 (3) MG/3ML IN SOLN: 3.0000 mL | Freq: Four times a day (QID) | RESPIRATORY_TRACT | Status: DC | PRN

## 2020-09-06 MED ORDER — MORPHINE SULFATE (PF) 2 MG/ML IV SOLN
2.0000 mg | INTRAVENOUS | Status: DC | PRN
  Administered 2020-09-06 – 2020-09-07 (×4): 2 mg via INTRAVENOUS
  Filled 2020-09-06 (×4): qty 1

## 2020-09-06 MED ORDER — ACETAMINOPHEN 650 MG RE SUPP: 650.0000 mg | Freq: Four times a day (QID) | RECTAL | Status: DC | PRN

## 2020-09-06 MED ORDER — ENOXAPARIN SODIUM 40 MG/0.4ML ~~LOC~~ SOLN
40.0000 mg | SUBCUTANEOUS | Status: DC
  Administered 2020-09-06: 40 mg via SUBCUTANEOUS
  Filled 2020-09-06: qty 0.4

## 2020-09-06 MED ORDER — SODIUM CHLORIDE 0.9 % IV SOLN
2.0000 g | INTRAVENOUS | Status: DC
  Administered 2020-09-06: 2 g via INTRAVENOUS
  Filled 2020-09-06: qty 20
  Filled 2020-09-06: qty 2

## 2020-09-06 MED ORDER — MAGNESIUM SULFATE IN D5W 1-5 GM/100ML-% IV SOLN
1.0000 g | Freq: Once | INTRAVENOUS | Status: AC
  Administered 2020-09-06: 1 g via INTRAVENOUS
  Filled 2020-09-06: qty 100

## 2020-09-06 MED ORDER — ONDANSETRON HCL 4 MG/2ML IJ SOLN: 4.0000 mg | Freq: Four times a day (QID) | INTRAMUSCULAR | Status: DC | PRN

## 2020-09-06 MED ORDER — HYDROCHLOROTHIAZIDE 12.5 MG PO CAPS: 12.5000 mg | ORAL_CAPSULE | Freq: Every day | ORAL | Status: DC

## 2020-09-06 MED ORDER — ACETAMINOPHEN 325 MG PO TABS: 650.0000 mg | ORAL_TABLET | Freq: Four times a day (QID) | ORAL | Status: DC | PRN

## 2020-09-06 MED ORDER — HYDROCODONE-ACETAMINOPHEN 5-325 MG PO TABS: 1.0000 | ORAL_TABLET | Freq: Four times a day (QID) | ORAL | Status: DC | PRN

## 2020-09-06 MED ORDER — METRONIDAZOLE IN NACL 5-0.79 MG/ML-% IV SOLN
500.0000 mg | Freq: Three times a day (TID) | INTRAVENOUS | Status: DC
  Administered 2020-09-06 – 2020-09-11 (×16): 500 mg via INTRAVENOUS
  Filled 2020-09-06 (×16): qty 100

## 2020-09-06 MED ORDER — OXYCODONE HCL 5 MG PO TABS
5.0000 mg | ORAL_TABLET | Freq: Four times a day (QID) | ORAL | Status: DC | PRN
  Administered 2020-09-06: 5 mg via ORAL
  Filled 2020-09-06: qty 1

## 2020-09-06 MED ORDER — ONDANSETRON HCL 4 MG PO TABS: 4.0000 mg | ORAL_TABLET | Freq: Four times a day (QID) | ORAL | Status: DC | PRN

## 2020-09-06 MED ORDER — POTASSIUM CHLORIDE CRYS ER 20 MEQ PO TBCR
40.0000 meq | EXTENDED_RELEASE_TABLET | ORAL | Status: AC
  Administered 2020-09-06: 40 meq via ORAL
  Filled 2020-09-06: qty 2

## 2020-09-06 MED ORDER — AZITHROMYCIN 500 MG PO TABS
500.0000 mg | ORAL_TABLET | Freq: Every day | ORAL | Status: AC
  Administered 2020-09-06 – 2020-09-09 (×4): 500 mg via ORAL
  Filled 2020-09-06 (×2): qty 2
  Filled 2020-09-06 (×2): qty 1

## 2020-09-06 MED ORDER — LISINOPRIL 10 MG PO TABS: 20.0000 mg | ORAL_TABLET | Freq: Every day | ORAL | Status: DC

## 2020-09-06 MED ORDER — SODIUM CHLORIDE 0.9% FLUSH
3.0000 mL | Freq: Two times a day (BID) | INTRAVENOUS | Status: DC
  Administered 2020-09-06 (×2): 3 mL via INTRAVENOUS

## 2020-09-06 NOTE — Plan of Care (Signed)

## 2020-09-06 NOTE — H&P (Addendum)
History and Physical    Donna ComeLillian Marie Sabol GNF:[id number] DOB: [birth year] DOA: 09/06/2020  Referring MD/NP/PA: Odie Seraimothy Opyd, MD PCP: Abram SanderAdamo, Elena M, MD  Patient coming from: Transfer from [facility]  Chief Complaint: Shortness of breath  I have personally briefly reviewed patient's old medical [facility]   HPI: Donna Phelps is a 60 y.o. female with medical history significant of hypertension, asthma, anxiety/depression, hep C s/p Harvoni treatment in 2019, and GERD who presented to [facility] yesterday with complaints of shortness of breath.  Over the last week patient reports that she has been having difficulty trying to catch her breath.  Noted associated symptoms of malaise/fatigue, mild intermittent nonproductive cough, chest pain, subjective fever, and chills.  She reports that she has not smoked any cigarettes in a couple of months now.  Patient intermittently drinks alcohol, but not on a daily basis.  Denies any recent drug use.  She had previously been in normal state of health.  In the emergency department at [facility] patient was noted to be febrile up to 101.3 F with tachycardia and tachypnea.  Patient was never noted to be hypoxic, but felt more comfortable on nasal cannula oxygen.  Labs were significant for WBC 17.9, potassium 3.4, and high-sensitivity troponin VII.  COVID-19 and influenza screening were both negative.  Chest x-ray that showed extensive opacity in the left lung.  CTA of the chest was done for further evaluation which revealed an extensive loculated pleural effusion consistent with empyema,  concern for extension into the mediastinum, and infiltrate/collapse of the left lower lung.  Dr. Laneta SimmersBartle of cardiothoracic surgery have been consulted and recommended transfer.  Sepsis protocol has been initiated with fluid bolus.  Blood cultures had been obtained.  Patient had been started on Rocephin, azithromycin, and then had Flagyl  added for anaerobic coverage.  Patient accepted to a cardiac telemetry bed.   ED Course: As seen above  Review of Systems  Constitutional: Positive for chills, fever and malaise/fatigue.  HENT: Negative for nosebleeds and sinus pain.   Eyes: Negative for photophobia and pain.  Respiratory: Positive for cough and shortness of breath.   Cardiovascular: Positive for chest pain. Negative for leg swelling.  Gastrointestinal: Negative for abdominal pain, diarrhea and vomiting.  Genitourinary: Negative for dysuria and hematuria.  Musculoskeletal: Positive for joint pain. Negative for falls.  Skin: Negative for rash.  Neurological: Positive for headaches.  Psychiatric/Behavioral: Negative for substance abuse.    Past Medical History:  Diagnosis Date  . Anxiety   . Arthritis   . Asthma   . Chronic hepatitis (HCC)    12 wk Harvoni Tx 2019  . Cirrhosis (HCC)   . Constipation   . Depression   . GERD (gastroesophageal reflux disease)   . High cholesterol   . Hypertension   . Pneumonia   . Prediabetes     Past Surgical History:  Procedure Laterality Date  . ABDOMINAL HYSTERECTOMY    . CATARACT EXTRACTION W/PHACO Right 12/31/2017   Procedure: CATARACT EXTRACTION PHACO AND INTRAOCULAR LENS PLACEMENT (IOC);  Surgeon: Nevada CraneKing, Bradley Mark, MD;  Location: [facility] ORS;  Service: Ophthalmology;  Laterality: Right;  fluid pack lot # [id number]   exp  08/27/2018 US   00:16.4 AP%   6.4 CDE    1.06   . CATARACT EXTRACTION W/PHACO Left 12/08/2018   Procedure: CATARACT EXTRACTION PHACO AND INTRAOCULAR LENS PLACEMENT (IOC) LEFT;  Surgeon: Nevada CraneKing, Bradley Mark, MD;  Location[facility];  Service:  Ophthalmology;  Laterality: Left;  . COLONOSCOPY WITH PROPOFOL N/A 04/06/2019   Procedure: COLONOSCOPY WITH PROPOFOL;  Surgeon: Toledo, Boykin Nearing, MD;  Location: [facility] ENDOSCOPY;  Service: Gastroenterology;  Laterality: N/A;  . ESOPHAGOGASTRODUODENOSCOPY (EGD) WITH PROPOFOL N/A 03/24/2016   Procedure:  ESOPHAGOGASTRODUODENOSCOPY (EGD) WITH PROPOFOL;  Surgeon: Scot Jun, MD;  Location: [facility] ENDOSCOPY;  Service: Endoscopy;  Laterality: N/A;  . ESOPHAGOGASTRODUODENOSCOPY (EGD) WITH PROPOFOL N/A 09/03/2016   Procedure: ESOPHAGOGASTRODUODENOSCOPY (EGD) WITH PROPOFOL;  Surgeon: Scot Jun, MD;  Location: [facility] ENDOSCOPY;  Service: Endoscopy;  Laterality: N/A;  . ESOPHAGOGASTRODUODENOSCOPY (EGD) WITH PROPOFOL N/A 04/06/2019   Procedure: ESOPHAGOGASTRODUODENOSCOPY (EGD) WITH PROPOFOL;  Surgeon: Toledo, Boykin Nearing, MD;  Location: [facility] ENDOSCOPY;  Service: Gastroenterology;  Laterality: N/A;  . TOTAL HIP ARTHROPLASTY Right 08/25/2019   Procedure: RIGHT TOTAL HIP ARTHROPLASTY ANTERIOR APPROACH;  Surgeon: Kennedy Bucker, MD;  Location: [facility] ORS;  Service: Orthopedics;  Laterality: Right;     reports that she has been smoking cigarettes. She has a 3.75 pack-year smoking history. She has never used smokeless tobacco. She reports current alcohol use. She reports that she does not use drugs.  No Known Allergies  History reviewed. No pertinent family history.  Prior to Admission medications   Medication Sig Start Date End Date Taking? Authorizing Provider  albuterol (PROVENTIL HFA;VENTOLIN HFA) 108 (90 Base) MCG/ACT inhaler Inhale 2 puffs into the lungs every 6 (six) hours as needed for wheezing or shortness of breath.     [provider]  enoxaparin (LOVENOX) 40 MG/0.4ML injection Inject 0.4 mLs (40 mg total) into the skin daily for 10 days. 08/25/19 09/04/19  Kennedy Bucker, MD  ferrous fumarate-b12-vitamic C-folic acid (TRINSICON / FOLTRIN) capsule Take 1 capsule by mouth 2 (two) times daily after a meal. 08/26/19   Kennedy Bucker, MD  lisinopril-hydrochlorothiazide (PRINZIDE,ZESTORETIC) 20-12.5 MG tablet Take 1 tablet by mouth daily.    [provider]  omeprazole (PRILOSEC) 40 MG capsule Take 40 mg by mouth 2 (two) times daily.     [provider]  oxyCODONE (ROXICODONE) 5 MG  immediate release tablet Take 1-2 tablets (5-10 mg total) by mouth every 4 (four) hours as needed for severe pain. 08/25/19   Kennedy Bucker, MD    Physical Exam:  Constitutional: Older female who appears to be in some mild distress. Vitals:   09/06/20 0904  BP: 121/85  Pulse: (!) 50  Resp: 20  Temp: 99.1 F (37.3 C)  TempSrc: Oral  SpO2: 93%   Eyes: PERRL, lids and conjunctivae normal ENMT: Mucous membranes are moist. Posterior pharynx clear of any exudate or lesions. Poor dentition.  Neck: normal, supple, no masses, no thyromegaly Respiratory: Decreased breath sounds noted on the left lung field.  Patient currently on 3 L nasal cannula oxygen with O2 saturations maintained no significant wheezes or rhonchi appreciated. Cardiovascular: Regular rate and rhythm with intermittent PVCs.  No lower extremity swelling appreciated. Abdomen: no tenderness, no masses palpated. No hepatosplenomegaly. Bowel sounds positive.  Musculoskeletal: no clubbing / cyanosis. No joint deformity upper and lower extremities. Good ROM, no contractures. Normal muscle tone.  Skin: no rashes, lesions, ulcers. No induration Neurologic: CN 2-12 grossly intact. Sensation intact, DTR normal. Strength 5/5 in all 4.  Psychiatric: Normal judgment and insight. Alert and oriented x 3. Normal mood.     Labs on Admission: I have personally reviewed following labs and imaging studies  CBC: Recent Labs  Lab 09/05/20 1312  WBC 17.9*  HGB 12.6  HCT 39.2  MCV  85.4  PLT 242   Basic Metabolic Panel: Recent Labs  Lab 09/05/20 1312  NA 135  K 3.4*  CL 98  CO2 24  GLUCOSE 114*  BUN 16  CREATININE 0.67  CALCIUM 8.9   GFR: Estimated Creatinine Clearance: 76.8 mL/min (by C-G formula based on SCr of 0.67 mg/dL). Liver Function Tests: No results for input(s): AST, ALT, ALKPHOS, BILITOT, PROT, ALBUMIN in the last 168 hours. No results for input(s): LIPASE, AMYLASE in the last 168 hours. No results for input(s):  AMMONIA in the last 168 hours. Coagulation Profile: No results for input(s): INR, PROTIME in the last 168 hours. Cardiac Enzymes: No results for input(s): CKTOTAL, CKMB, CKMBINDEX, TROPONINI in the last 168 hours. BNP (last 3 results) No results for input(s): PROBNP in the last 8760 hours. HbA1C: No results for input(s): HGBA1C in the last 72 hours. CBG: No results for input(s): GLUCAP in the last 168 hours. Lipid Profile: No results for input(s): CHOL, HDL, LDLCALC, TRIG, CHOLHDL, LDLDIRECT in the last 72 hours. Thyroid Function Tests: No results for input(s): TSH, T4TOTAL, FREET4, T3FREE, THYROIDAB in the last 72 hours. Anemia Panel: No results for input(s): VITAMINB12, FOLATE, FERRITIN, TIBC, IRON, RETICCTPCT in the last 72 hours. Urine analysis:    Component Value Date/Time   COLORURINE YELLOW (A) 08/23/2019 1309   APPEARANCEUR HAZY (A) 08/23/2019 1309   LABSPEC 1.019 08/23/2019 1309   PHURINE 6.0 08/23/2019 1309   GLUCOSEU NEGATIVE 08/23/2019 1309   HGBUR NEGATIVE 08/23/2019 1309   BILIRUBINUR NEGATIVE 08/23/2019 1309   KETONESUR NEGATIVE 08/23/2019 1309   PROTEINUR NEGATIVE 08/23/2019 1309   NITRITE NEGATIVE 08/23/2019 1309   LEUKOCYTESUR SMALL (A) 08/23/2019 1309   Sepsis Labs: Recent Results (from the past 240 hour(s))  Blood culture (routine x 2)     Status: None (Preliminary result)   Collection Time: 09/05/20  2:30 PM   Specimen: BLOOD  Result Value Ref Range Status   Specimen Description BLOOD BLOOD LEFT FOREARM  Final   Special Requests   Final    BOTTLES DRAWN AEROBIC AND ANAEROBIC Blood Culture adequate volume   Culture   Final    NO GROWTH < 24 HOURS Performed at [facility], [address] [place], [place] [address]    Report Status PENDING  Incomplete  Blood culture (routine x 2)     Status: None (Preliminary result)   Collection Time: 09/05/20  2:45 PM   Specimen: BLOOD  Result Value Ref Range Status   Specimen Description BLOOD LEFT  ANTECUBITAL  Final   Special Requests   Final    BOTTLES DRAWN AEROBIC AND ANAEROBIC Blood Culture results may not be optimal due to an excessive volume of blood received in culture bottles   Culture   Final    NO GROWTH < 24 HOURS Performed at [facility], [address] [place], [place] [address]    Report Status PENDING  Incomplete  Resp Panel by RT-PCR (Flu A&B, Covid) Nasopharyngeal Swab     Status: None   Collection Time: 09/05/20  4:25 PM   Specimen: Nasopharyngeal Swab; Nasopharyngeal(NP) swabs in vial transport medium  Result Value Ref Range Status   SARS Coronavirus 2 by RT PCR NEGATIVE NEGATIVE Final    Comment: (NOTE) SARS-CoV-2 target nucleic acids are NOT DETECTED.  The SARS-CoV-2 RNA is generally detectable in upper respiratory specimens during the acute phase of infection. The lowest concentration of SARS-CoV-2 viral copies this assay can detect is 138 copies/mL. A negative  result does not preclude SARS-Cov-2 infection and should not be used as the sole basis for treatment or other patient management decisions. A negative result may occur with  improper specimen collection/handling, submission of specimen other than nasopharyngeal swab, presence of viral mutation(s) within the areas targeted by this assay, and inadequate number of viral copies(<138 copies/mL). A negative result must be combined with clinical observations, patient history, and epidemiological information. The expected result is Negative.  Fact Sheet for Patients:  BloggerCourse.com  Fact Sheet for Healthcare Providers:  SeriousBroker.it  This test is no t yet approved or cleared by the [place] FDA and  has been authorized for detection and/or diagnosis of SARS-CoV-2 by FDA under an Emergency Use Authorization (EUA). This EUA will remain  in effect (meaning this test can be used) for the duration of the COVID-19 declaration under  Section 564(b)(1) of the Act, 21 U.S.C.section 360bbb-3(b)(1), unless the authorization is terminated  or revoked sooner.       Influenza A by PCR NEGATIVE NEGATIVE Final   Influenza B by PCR NEGATIVE NEGATIVE Final    Comment: (NOTE) The Xpert Xpress SARS-CoV-2/FLU/RSV plus assay is intended as an aid in the diagnosis of influenza from Nasopharyngeal swab specimens and should not be used as a sole basis for treatment. Nasal washings and aspirates are unacceptable for Xpert Xpress SARS-CoV-2/FLU/RSV testing.  Fact Sheet for Patients: BloggerCourse.com  Fact Sheet for Healthcare Providers: SeriousBroker.it  This test is not yet approved or cleared by the [place] FDA and has been authorized for detection and/or diagnosis of SARS-CoV-2 by FDA under an Emergency Use Authorization (EUA). This EUA will remain in effect (meaning this test can be used) for the duration of the COVID-19 declaration under Section 564(b)(1) of the Act, 21 U.S.C. section 360bbb-3(b)(1), unless the authorization is terminated or revoked.  Performed at [facility], [address] [place], [place] [address]      Radiological Exams on Admission: DG Chest 2 View  Result Date: 09/05/2020 CLINICAL DATA:  Chest pain and shortness of breath EXAM: CHEST - 2 VIEW COMPARISON:  April 24 2015 FINDINGS: There is extensive opacity throughout much of the left lung with multiple areas of lobular opacity throughout the left upper lung peripherally and mid to lower lung region more centrally. Right lung is clear. The heart is borderline enlarged with pulmonary vascularity normal. No adenopathy. No bone lesions. IMPRESSION: Extensive opacity on the left. At least some of this opacity is felt to be due to loculated pleural effusion. There may well be areas of superimposed infiltrate on the left. Underlying mass lesions cannot be excluded given the lobular  opacity in several areas on the left. Given this appearance of the left lung, correlation with contrast enhanced chest CT is felt to be advisable. Right lung is clear. Heart is borderline enlarged. No adenopathy evident by radiography. Electronically Signed   By: Bretta Bang III M.D.   On: 09/05/2020 13:39   CT CHEST W CONTRAST  Result Date: 09/05/2020 CLINICAL DATA:  Shortness of breath. Chest pain. Headache. Abnormal radiography on the left. EXAM: CT CHEST WITH CONTRAST TECHNIQUE: Multidetector CT imaging of the chest was performed during intravenous contrast administration. CONTRAST:  52mL OMNIPAQUE IOHEXOL 300 MG/ML  SOLN COMPARISON:  Chest radiography earlier same day. Previous chest CT 04/02/2010. FINDINGS: Cardiovascular: Heart size is normal. Some coronary artery calcification is evident in the left system. Some aortic atherosclerotic calcification is present. Mediastinum/Nodes: Mild reactive nodal enlargement. Lungs/Pleura: The right lung  is clear except for mild atelectasis at the posterior right base. There is a tiny amount of pleural fluid on the right, including a small amount within the major fissure. On the left, there is extensive loculated pleural fluid consistent with empyema. This is present along both the lateral and medial pleural spaces. Question if there could be some early extension into the mediastinum anteriorly. Infiltrate/collapse affects the left lower lobe. Some volume loss in the left upper lobe as well. Upper Abdomen: Right renal cyst.  No acute upper abdominal finding. Musculoskeletal: Ordinary thoracic degenerative changes. IMPRESSION: 1. Extensive loculated pleural fluid on the left consistent with empyema. This is present along both the lateral and medial pleural spaces. Question if there could be some early extension into the mediastinum anteriorly. 2. Infiltrate/collapse affects the left lower lobe. Some volume loss in the left upper lobe as well. 3. Tiny amount of  pleural fluid on the right, including a small amount within the major fissure. 4. Aortic atherosclerosis. Coronary artery calcification. Aortic Atherosclerosis (ICD10-I70.0). Electronically Signed   By: Paulina Fusi M.D.   On: 09/05/2020 17:21    EKG: Independently reviewed.  Sinus tachycardia 107 bpm with multiple PVCs   Assessment/Plan Sepsis secondary to left empyema: Acute.  Patient had presented to the outside facility with fever up to 101.3 F with tachycardia and tachypnea meeting SIRS criteria.  White blood cell count elevated at 17.9 but likely as it was seen to be reassuring at 1.1.  Imaging studies significant for a loculated left-sided effusion concerning for empyema with possible. -Admit to medical telemetry  -Continuous pulse oximetry with nasal cannula oxygen as -Follow-up blood culture -Heart healthy diet, and likely n.p.o. after midnight for need of VATS -Oxycodone IR/morphine IV as needed for moderate to severe pain respectively -Check echocardiogram -Continue empiric antibiotics of Rocephin, azithromycin, and metronidazole day 2 -DuoNebs as needed for shortness of breath/wheezing -Appreciate Dr. Laneta Simmers of cardiothoracic surgery, we will follow-up for any further recommendation  Chest pain and frequent PVCs: She presented with complaints of chest pain, but cardiac troponins were negative x2.  Patient was noted to have intermittent PVCs. -Correct electrolyte abnormalities  -Follow-up echocardiogram -Follow-up telemetry overnight  Hypokalemia: Acute.  At the outside facility yesterday potassium was just mildly low at 3.4, but never replaced.  Repeat potassium 3.2. -Give 40 mEq of potassium chloride p.o. -Continue to monitor and replace as needed  Hypoalbuminemia: Acute.  Albumin down to 2.6 -Check prealbumin in a.m.  History of tobacco and alcohol abuse: Patient reports she quit smoking cigarettes couple months back, but had smoked since the age of 63.  She had previously  had a significant history of alcohol abuse as well, but reports that she no longer drinks on a daily basis.  DVT prophylaxis: Lovenox Code Status: Full Family Communication: Patient son had already been updated Disposition Plan: Please discharge home in 2 to 3 days Consults called: Cardiothoracic surgery Admission status: Inpatient, require more than 2 midnight stay for need of that  Clydie Braun MD [facility]   If 7PM-7AM, please contact night-coverage   09/06/2020, 10:20 AM

## 2020-09-06 NOTE — Consult Note (Signed)
[address]       [place] [address]             [phone]      Cardiothoracic Surgery Consultation  Reason for Consult: Left empyema Referring Physician: Madelyn Flavors, MD  Donna Phelps is an 60 y.o. female.  HPI:   The patient is a 60 year old woman with a history of hypertension, hyperlipidemia, chronic hepatitis C status post treatment in 2019, cirrhosis, GERD, smoking, anxiety and depression who presented to the [place] [facility] ER yesterday with a 2-week history of shortness of breath, chest pain, headache, and chills.  A chest x-ray showed extensive opacity on the left.  CT scan of the chest showed extensive loculated left pleural effusion consistent with empyema with collapse of the left lower lobe and part of the left upper lobe.  The right lung was clear with mild atelectasis in the posterior right base and a tiny pleural effusion.  She was transferred to [facility] for further evaluation and treatment.  The patient lives alone at home.  She is a smoker but has not smoked in the past couple weeks.  She has a remote history of IV drug abuse as a youth.  She said that she drinks a few beers a week now.  She denies any other drug use.  She has not seen a dentist in years and has bad teeth.  Past Medical History:  Diagnosis Date  . Anxiety   . Arthritis   . Asthma   . Chronic hepatitis (HCC)    12 wk [place] Tx 2019  . Cirrhosis (HCC)   . Constipation   . Depression   . GERD (gastroesophageal reflux disease)   . High cholesterol   . Hypertension   . Pneumonia   . Prediabetes     Past Surgical History:  Procedure Laterality Date  . ABDOMINAL HYSTERECTOMY    . CATARACT EXTRACTION W/PHACO Right 12/31/2017   Procedure: CATARACT EXTRACTION PHACO AND INTRAOCULAR LENS PLACEMENT (IOC);  Surgeon: Nevada Crane, MD;  Location: [facility] ORS;  Service: Ophthalmology;  Laterality: Right;  fluid pack lot # [id number]   exp  08/27/2018 [place]   00:16.4 AP%    6.4 CDE    1.06   . CATARACT EXTRACTION W/PHACO Left 12/08/2018   Procedure: CATARACT EXTRACTION PHACO AND INTRAOCULAR LENS PLACEMENT (IOC) LEFT;  Surgeon: Nevada Crane, MD;  Location: [facility];  Service: Ophthalmology;  Laterality: Left;  . COLONOSCOPY WITH PROPOFOL N/A 04/06/2019   Procedure: COLONOSCOPY WITH PROPOFOL;  Surgeon: Toledo, Boykin Nearing, MD;  Location: [facility] ENDOSCOPY;  Service: Gastroenterology;  Laterality: N/A;  . ESOPHAGOGASTRODUODENOSCOPY (EGD) WITH PROPOFOL N/A 03/24/2016   Procedure: ESOPHAGOGASTRODUODENOSCOPY (EGD) WITH PROPOFOL;  Surgeon: Scot Jun, MD;  Location: [facility] END[facility];  Service: Endoscopy;  Laterality: N/A;  . ESOPHAGOGASTRODUODENOSCOPY (EGD) WITH PROPOFOL N/A 09/03/2016   Procedure: ESOPHAGOGASTRODUODENOSCOPY (EGD) WITH PROPOFOL;  Surgeon: Scot Jun, MD;  Location: [facility] ENDOSCOPY;  Service: Endoscopy;  Laterality: N/A;  . ESOPHAGOGASTRODUODENOSCOPY (EGD) WITH PROPOFOL N/A 04/06/2019   Procedure: ESOPHAGOGASTRODUODENOSCOPY (EGD) WITH PROPOFOL;  Surgeon: Toledo, Boykin Nearing, MD;  Location: [facility] ENDOSCOPY;  Service: Gastroenterology;  Laterality: N/A;  . TOTAL HIP ARTHROPLASTY Right 08/25/2019   Procedure: RIGHT TOTAL HIP ARTHROPLASTY ANTERIOR APPROACH;  Surgeon: Kennedy Bucker, MD;  Location: [facility] ORS;  Service: Orthopedics;  Laterality: Right;    History reviewed. No pertinent family history.  Social History:  reports that she has been smoking cigarettes. She  has a 3.75 pack-year smoking history. She has never used smokeless tobacco. She reports current alcohol use. She reports that she does not use drugs.  Allergies: No Known Allergies  Medications:  I have reviewed the patient's current medications. Prior to Admission:  Medications Prior to Admission  Medication Sig Dispense Refill Last Dose  . acetaminophen (TYLENOL) 500 MG tablet Take 1,000 mg by mouth every 6 (six) hours as needed for mild pain.   Past Week at Unknown time  .  albuterol (PROVENTIL HFA;VENTOLIN HFA) 108 (90 Base) MCG/ACT inhaler Inhale 2 puffs into the lungs every 6 (six) hours as needed for wheezing or shortness of breath.    09/05/2020 at Unknown time  . lisinopril-hydrochlorothiazide (PRINZIDE,ZESTORETIC) 20-12.5 MG tablet Take 1 tablet by mouth daily.   09/05/2020 at Unknown time  . naproxen (NAPROSYN) 500 MG tablet Take 500 mg by mouth See admin instructions. Take 1 tablet (500mg ) twice daily for 2 weeks, then as needed.   09/05/2020 at Unknown time  . omeprazole (PRILOSEC) 40 MG capsule Take 40 mg by mouth daily.   09/05/2020 at Unknown time  . enoxaparin (LOVENOX) 40 MG/0.4ML injection Inject 0.4 mLs (40 mg total) into the skin daily for 10 days. 4 mL 0   . ferrous fumarate-b12-vitamic C-folic acid (TRINSICON / FOLTRIN) capsule Take 1 capsule by mouth 2 (two) times daily after a meal. (Patient not taking: Reported on 09/06/2020) 20 capsule 0 Not Taking at Unknown time  . oxyCODONE (ROXICODONE) 5 MG immediate release tablet Take 1-2 tablets (5-10 mg total) by mouth every 4 (four) hours as needed for severe pain. (Patient not taking: Reported on 09/06/2020) 40 tablet 0 Not Taking at Unknown time   Scheduled: . enoxaparin (LOVENOX) injection  40 mg Subcutaneous Q24H  . sodium chloride flush  3 mL Intravenous Q12H   Continuous: . cefTRIAXone (ROCEPHIN)  IV    . metronidazole 500 mg (09/06/20 1227)   VQM:[id number] **OR** acetaminophen, morphine injection, ondansetron **OR** ondansetron (ZOFRAN) IV Anti-infectives (From admission, onward)   Start     Dose/Rate Route Frequency Ordered Stop   09/06/20 1800  cefTRIAXone (ROCEPHIN) 2 g in sodium chloride 0.9 % 100 mL IVPB        2 g 200 mL/hr over 30 Minutes Intravenous Every 24 hours 09/06/20 1036     09/06/20 1300  metroNIDAZOLE (FLAGYL) IVPB 500 mg        500 mg 100 mL/hr over 60 Minutes Intravenous Every 8 hours 09/06/20 1036        Results for orders placed or performed during the hospital encounter  of 09/06/20 (from the past 48 hour(s))  HIV Antibody (routine testing w rflx)     Status: None   Collection Time: 09/06/20 10:58 AM  Result Value Ref Range   HIV Screen 4th Generation wRfx Non Reactive Non Reactive    Comment: Performed at [facility], [address], [place], [place] [address]  Comprehensive metabolic panel     Status: Abnormal   Collection Time: 09/06/20 10:58 AM  Result Value Ref Range   Sodium 137 135 - 145 mmol/L   Potassium 3.2 (L) 3.5 - 5.1 mmol/L   Chloride 101 98 - 111 mmol/L   CO2 26 22 - 32 mmol/L   Glucose, Bld 113 (H) 70 - 99 mg/dL    Comment: Glucose reference range applies only to samples taken after fasting for at least 8 hours.   BUN 11 8 - 23 mg/dL   Creatinine,  Ser 0.74 0.44 - 1.00 mg/dL   Calcium 8.4 (L) 8.9 - 10.3 mg/dL   Total Protein 6.9 6.5 - 8.1 g/dL   Albumin 2.6 (L) 3.5 - 5.0 g/dL   AST 22 15 - 41 U/L   ALT 19 0 - 44 U/L   Alkaline Phosphatase 61 38 - 126 U/L   Total Bilirubin 0.5 0.3 - 1.2 mg/dL   GFR, Estimated >40>60 >98>60 mL/min    Comment: (NOTE) Calculated using the CKD-EPI Creatinine Equation (2021)    Anion gap 10 5 - 15    Comment: Performed at [facility], [address] [place], [place] [address]  CBC WITH DIFFERENTIAL     Status: Abnormal   Collection Time: 09/06/20 10:58 AM  Result Value Ref Range   WBC 16.4 (H) 4.0 - 10.5 K/uL   RBC 4.31 3.87 - 5.11 MIL/uL   Hemoglobin 11.7 (L) 12.0 - 15.0 g/dL   HCT 78.236.8 95.636.0 - 21.346.0 %   MCV 85.4 80.0 - 100.0 fL   MCH 27.1 26.0 - 34.0 pg   MCHC 31.8 30.0 - 36.0 g/dL   RDW 08.615.6 (H) 57.811.5 - 46.915.5 %   Platelets 225 150 - 400 K/uL   nRBC 0.0 0.0 - 0.2 %   Neutrophils Relative % 79 %   Neutro Abs 13.1 (H) 1.7 - 7.7 K/uL   Lymphocytes Relative 10 %   Lymphs Abs 1.7 0.7 - 4.0 K/uL   Monocytes Relative 10 %   Monocytes Absolute 1.6 (H) 0.1 - 1.0 K/uL   Eosinophils Relative 0 %   Eosinophils Absolute 0.0 0.0 - 0.5 K/uL   Basophils Relative 0 %   Basophils Absolute 0.0 0.0 -  0.1 K/uL   Immature Granulocytes 1 %   Abs Immature Granulocytes 0.10 (H) 0.00 - 0.07 K/uL    Comment: Performed at [facility], [address] [place], [place] [address]  Magnesium     Status: None   Collection Time: 09/06/20 10:58 AM  Result Value Ref Range   Magnesium 1.7 1.7 - 2.4 mg/dL    Comment: Performed at [facility], [address] [place], [place] [address]    DG Chest 2 View  Result Date: 09/05/2020 CLINICAL DATA:  Chest pain and shortness of breath EXAM: CHEST - 2 VIEW COMPARISON:  April 24 2015 FINDINGS: There is extensive opacity throughout much of the left lung with multiple areas of lobular opacity throughout the left upper lung peripherally and mid to lower lung region more centrally. Right lung is clear. The heart is borderline enlarged with pulmonary vascularity normal. No adenopathy. No bone lesions. IMPRESSION: Extensive opacity on the left. At least some of this opacity is felt to be due to loculated pleural effusion. There may well be areas of superimposed infiltrate on the left. Underlying mass lesions cannot be excluded given the lobular opacity in several areas on the left. Given this appearance of the left lung, correlation with contrast enhanced chest CT is felt to be advisable. Right lung is clear. Heart is borderline enlarged. No adenopathy evident by radiography. Electronically Signed   By: Bretta BangWilliam  Woodruff III M.D.   On: 09/05/2020 13:39   CT CHEST W CONTRAST  Result Date: 09/05/2020 CLINICAL DATA:  Shortness of breath. Chest pain. Headache. Abnormal radiography on the left. EXAM: CT CHEST WITH CONTRAST TECHNIQUE: Multidetector CT imaging of the chest was performed during intravenous contrast administration. CONTRAST:  75mL OMNIPAQUE IOHEXOL 300 MG/ML  SOLN COMPARISON:  Chest radiography earlier same day. Previous chest CT 04/02/2010. FINDINGS: Cardiovascular: Heart size is normal. Some coronary artery calcification is evident in the left system.  Some aortic atherosclerotic calcification is present. Mediastinum/Nodes: Mild reactive nodal enlargement. Lungs/Pleura: The right lung is clear except for mild atelectasis at the posterior right base. There is a tiny amount of pleural fluid on the right, including a small amount within the major fissure. On the left, there is extensive loculated pleural fluid consistent with empyema. This is present along both the lateral and medial pleural spaces. Question if there could be some early extension into the mediastinum anteriorly. Infiltrate/collapse affects the left lower lobe. Some volume loss in the left upper lobe as well. Upper Abdomen: Right renal cyst.  No acute upper abdominal finding. Musculoskeletal: Ordinary thoracic degenerative changes. IMPRESSION: 1. Extensive loculated pleural fluid on the left consistent with empyema. This is present along both the lateral and medial pleural spaces. Question if there could be some early extension into the mediastinum anteriorly. 2. Infiltrate/collapse affects the left lower lobe. Some volume loss in the left upper lobe as well. 3. Tiny amount of pleural fluid on the right, including a small amount within the major fissure. 4. Aortic atherosclerosis. Coronary artery calcification. Aortic Atherosclerosis (ICD10-I70.0). Electronically Signed   By: Paulina Fusi M.D.   On: 09/05/2020 17:21    Review of Systems  Constitutional: Positive for activity change, appetite change, chills, fatigue and fever.  HENT:       Poor dentition and states she needs to have her teeth pulled  Eyes: Negative.   Respiratory: Positive for cough, chest tightness and shortness of breath.   Cardiovascular: Negative for chest pain and leg swelling.  Gastrointestinal: Negative.   Endocrine: Negative.   Genitourinary: Negative.   Musculoskeletal: Positive for arthralgias.  Skin: Negative.   Allergic/Immunologic: Negative.   Neurological: Positive for headaches. Negative for dizziness and  syncope.  Hematological: Negative.   Psychiatric/Behavioral:       Anxiety and depression   Blood pressure (!) 149/87, pulse 60, temperature 99.1 F (37.3 C), temperature source Oral, resp. rate 16, height 5\' 6"  (1.676 m), weight 75.8 kg, SpO2 97 %. Physical Exam Constitutional:      Appearance: Normal appearance. She is normal weight.  HENT:     Head: Normocephalic and atraumatic.     Mouth/Throat:     Comments: Poor dentition with multiple missing and broken off teeth Eyes:     Extraocular Movements: Extraocular movements intact.     Conjunctiva/sclera: Conjunctivae normal.     Pupils: Pupils are equal, round, and reactive to light.  Neck:     Vascular: No carotid bruit.  Cardiovascular:     Rate and Rhythm: Normal rate and regular rhythm.     Pulses: Normal pulses.     Heart sounds: Normal heart sounds. No murmur heard.   Pulmonary:     Effort: Pulmonary effort is normal.     Comments: Decreased breath sounds over the left chest Abdominal:     General: Abdomen is flat. Bowel sounds are normal. There is no distension.     Palpations: Abdomen is soft.     Tenderness: There is no abdominal tenderness.  Musculoskeletal:        General: No swelling.     Cervical back: Normal range of motion and neck supple.  Lymphadenopathy:     Cervical: No cervical adenopathy.  Skin:    General: Skin is warm and dry.  Neurological:  General: No focal deficit present.     Mental Status: She is alert and oriented to person, place, and time.  Psychiatric:        Mood and Affect: Mood normal.        Behavior: Behavior normal.    Narrative & Impression  CLINICAL DATA:  Shortness of breath. Chest pain. Headache. Abnormal radiography on the left.  EXAM: CT CHEST WITH CONTRAST  TECHNIQUE: Multidetector CT imaging of the chest was performed during intravenous contrast administration.  CONTRAST:  76mL OMNIPAQUE IOHEXOL 300 MG/ML  SOLN  COMPARISON:  Chest radiography earlier  same day. Previous chest CT 04/02/2010.  FINDINGS: Cardiovascular: Heart size is normal. Some coronary artery calcification is evident in the left system. Some aortic atherosclerotic calcification is present.  Mediastinum/Nodes: Mild reactive nodal enlargement.  Lungs/Pleura: The right lung is clear except for mild atelectasis at the posterior right base. There is a tiny amount of pleural fluid on the right, including a small amount within the major fissure.  On the left, there is extensive loculated pleural fluid consistent with empyema. This is present along both the lateral and medial pleural spaces. Question if there could be some early extension into the mediastinum anteriorly. Infiltrate/collapse affects the left lower lobe. Some volume loss in the left upper lobe as well.  Upper Abdomen: Right renal cyst.  No acute upper abdominal finding.  Musculoskeletal: Ordinary thoracic degenerative changes.  IMPRESSION: 1. Extensive loculated pleural fluid on the left consistent with empyema. This is present along both the lateral and medial pleural spaces. Question if there could be some early extension into the mediastinum anteriorly. 2. Infiltrate/collapse affects the left lower lobe. Some volume loss in the left upper lobe as well. 3. Tiny amount of pleural fluid on the right, including a small amount within the major fissure. 4. Aortic atherosclerosis. Coronary artery calcification.  Aortic Atherosclerosis (ICD10-I70.0).   Electronically Signed   By: Paulina Fusi M.D.   On: 09/05/2020 17:21     Assessment/Plan:  This patient has an extensive left empyema with left lower lobe and partial left upper lobe collapse and likely pneumonia.  She has bad teeth and a smoking history both of which increase the risk of this.  This will require drainage by VATS and likely thoracotomy.  I discussed the operative procedure with the patient and her son by telephone.  I  discussed alternatives, benefits, and risk including but not limited to bleeding, blood transfusion, infection, injury to the lung, persistent pleural space problems, and respiratory failure.  They understand agree to proceed.  She will be scheduled for tomorrow afternoon.  I spent 40 minutes performing this consultation and > 50% of this time was spent face to face counseling and coordinating the care of this patient's left empyema.  Alleen Borne 09/06/2020, 2:44 PM

## 2020-09-06 NOTE — ED Notes (Signed)
EMTALA reviewed by Charge RN 

## 2020-09-07 ENCOUNTER — Inpatient Hospital Stay (HOSPITAL_COMMUNITY): Payer: Medicaid Other | Admitting: Certified Registered"

## 2020-09-07 ENCOUNTER — Encounter (HOSPITAL_COMMUNITY)
Admission: AD | Disposition: A | Discharge status: Home Health | Payer: Self-pay | Source: Other Acute Inpatient Hospital | Attending: Family Medicine

## 2020-09-07 ENCOUNTER — Inpatient Hospital Stay (HOSPITAL_COMMUNITY): Payer: Medicaid Other

## 2020-09-07 ENCOUNTER — Encounter (HOSPITAL_COMMUNITY): Payer: Self-pay | Admitting: Internal Medicine

## 2020-09-07 ENCOUNTER — Other Ambulatory Visit (HOSPITAL_COMMUNITY): Payer: Medicaid Other

## 2020-09-07 DIAGNOSIS — I10 Essential (primary) hypertension: Secondary | ICD-10-CM

## 2020-09-07 DIAGNOSIS — Z96641 Presence of right artificial hip joint: Secondary | ICD-10-CM

## 2020-09-07 DIAGNOSIS — A419 Sepsis, unspecified organism: Secondary | ICD-10-CM | POA: Diagnosis not present

## 2020-09-07 DIAGNOSIS — E8809 Other disorders of plasma-protein metabolism, not elsewhere classified: Secondary | ICD-10-CM | POA: Diagnosis not present

## 2020-09-07 DIAGNOSIS — J181 Lobar pneumonia, unspecified organism: Secondary | ICD-10-CM

## 2020-09-07 DIAGNOSIS — J869 Pyothorax without fistula: Secondary | ICD-10-CM | POA: Diagnosis not present

## 2020-09-07 HISTORY — PX: EMPYEMA DRAINAGE: SHX5097

## 2020-09-07 HISTORY — PX: VIDEO ASSISTED THORACOSCOPY (VATS)/THOROCOTOMY: SHX6173

## 2020-09-07 HISTORY — PX: DECORTICATION: SHX5101

## 2020-09-07 LAB — CBC
HCT: 32.3 % — ABNORMAL LOW (ref 36.0–46.0)
Hemoglobin: 10.6 g/dL — ABNORMAL LOW (ref 12.0–15.0)
MCH: 28 pg (ref 26.0–34.0)
MCHC: 32.8 g/dL (ref 30.0–36.0)
MCV: 85.4 fL (ref 80.0–100.0)
Platelets: 234 10*3/uL (ref 150–400)
RBC: 3.78 MIL/uL — ABNORMAL LOW (ref 3.87–5.11)
RDW: 15.7 % — ABNORMAL HIGH (ref 11.5–15.5)
WBC: 15.4 10*3/uL — ABNORMAL HIGH (ref 4.0–10.5)
nRBC: 0 % (ref 0.0–0.2)

## 2020-09-07 LAB — BASIC METABOLIC PANEL
Anion gap: 10 (ref 5–15)
BUN: 10 mg/dL (ref 8–23)
CO2: 26 mmol/L (ref 22–32)
Calcium: 8.4 mg/dL — ABNORMAL LOW (ref 8.9–10.3)
Chloride: 100 mmol/L (ref 98–111)
Creatinine, Ser: 0.7 mg/dL (ref 0.44–1.00)
GFR, Estimated: >60 mL/min (ref >60)
Glucose, Bld: 108 mg/dL — ABNORMAL HIGH (ref 70–99)
Potassium: 3.6 mmol/L (ref 3.5–5.1)
Sodium: 136 mmol/L (ref 135–145)

## 2020-09-07 LAB — PREALBUMIN: Prealbumin: 5.9 mg/dL — ABNORMAL LOW (ref 18–38)

## 2020-09-07 LAB — GLUCOSE, CAPILLARY
Glucose-Capillary: 115 mg/dL — ABNORMAL HIGH (ref 70–99)
Glucose-Capillary: 134 mg/dL — ABNORMAL HIGH (ref 70–99)

## 2020-09-07 LAB — SURGICAL PCR SCREEN
MRSA, PCR: NEGATIVE
Staphylococcus aureus: NEGATIVE

## 2020-09-07 SURGERY — VIDEO ASSISTED THORACOSCOPY (VATS)/THOROCOTOMY
Anesthesia: General | Site: Chest | Laterality: Left

## 2020-09-07 MED ORDER — ONDANSETRON HCL 4 MG/2ML IJ SOLN
INTRAMUSCULAR | Status: DC | PRN
  Administered 2020-09-07: 4 mg via INTRAVENOUS

## 2020-09-07 MED ORDER — DIPHENHYDRAMINE HCL 50 MG/ML IJ SOLN: 12.5000 mg | Freq: Four times a day (QID) | INTRAMUSCULAR | Status: DC | PRN

## 2020-09-07 MED ORDER — OXYCODONE HCL 5 MG PO TABS
5.0000 mg | ORAL_TABLET | ORAL | Status: DC | PRN
  Administered 2020-09-09 – 2020-09-11 (×7): 10 mg via ORAL
  Administered 2020-09-11: 5 mg via ORAL
  Filled 2020-09-07 (×2): qty 2
  Filled 2020-09-07: qty 1
  Filled 2020-09-07 (×6): qty 2

## 2020-09-07 MED ORDER — NALOXONE HCL 0.4 MG/ML IJ SOLN: 0.4000 mg | INTRAMUSCULAR | Status: DC | PRN

## 2020-09-07 MED ORDER — SODIUM CHLORIDE 0.9 % IV SOLN
2.0000 g | INTRAVENOUS | Status: AC
  Administered 2020-09-07: 2 g via INTRAVENOUS
  Filled 2020-09-07: qty 2

## 2020-09-07 MED ORDER — ROCURONIUM BROMIDE 10 MG/ML (PF) SYRINGE
PREFILLED_SYRINGE | INTRAVENOUS | Status: AC
  Filled 2020-09-07: qty 10

## 2020-09-07 MED ORDER — SODIUM CHLORIDE 0.9 % IV SOLN: INTRAVENOUS | Status: DC | PRN

## 2020-09-07 MED ORDER — KETAMINE HCL 10 MG/ML IJ SOLN
INTRAMUSCULAR | Status: DC | PRN
  Administered 2020-09-07: 50 mg via INTRAVENOUS

## 2020-09-07 MED ORDER — INSULIN ASPART 100 UNIT/ML ~~LOC~~ SOLN
0.0000 [IU] | SUBCUTANEOUS | Status: DC
  Administered 2020-09-08 – 2020-09-09 (×7): 2 [IU] via SUBCUTANEOUS

## 2020-09-07 MED ORDER — POLYETHYLENE GLYCOL 3350 17 G PO PACK
17.0000 g | PACK | Freq: Two times a day (BID) | ORAL | Status: DC | PRN
  Filled 2020-09-07: qty 1

## 2020-09-07 MED ORDER — SENNOSIDES-DOCUSATE SODIUM 8.6-50 MG PO TABS
1.0000 | ORAL_TABLET | Freq: Two times a day (BID) | ORAL | Status: DC | PRN
  Filled 2020-09-07: qty 1

## 2020-09-07 MED ORDER — BISACODYL 5 MG PO TBEC
10.0000 mg | DELAYED_RELEASE_TABLET | Freq: Every day | ORAL | Status: DC
  Administered 2020-09-08: 10 mg via ORAL
  Filled 2020-09-07 (×3): qty 2

## 2020-09-07 MED ORDER — HYDROMORPHONE HCL 1 MG/ML IJ SOLN
0.2500 mg | INTRAMUSCULAR | Status: DC | PRN
  Administered 2020-09-07: 0.5 mg via INTRAVENOUS

## 2020-09-07 MED ORDER — MORPHINE SULFATE 1 MG/ML IV SOLN PCA
INTRAVENOUS | Status: DC
Start: 2020-09-07 — End: 2020-09-09
  Administered 2020-09-08: 0 mg via INTRAVENOUS
  Administered 2020-09-08: 6 mg via INTRAVENOUS
  Administered 2020-09-08: 1 mg via INTRAVENOUS
  Administered 2020-09-08: 1.5 mg via INTRAVENOUS
  Administered 2020-09-08: 4.5 mg via INTRAVENOUS
  Administered 2020-09-08: 3 mg via INTRAVENOUS
  Administered 2020-09-09: 10.5 mg via INTRAVENOUS
  Administered 2020-09-09: 15 mg via INTRAVENOUS
  Administered 2020-09-09: 0 mg via INTRAVENOUS
  Filled 2020-09-07 (×5): qty 30

## 2020-09-07 MED ORDER — SODIUM CHLORIDE 0.9% FLUSH: 9.0000 mL | INTRAVENOUS | Status: DC | PRN

## 2020-09-07 MED ORDER — DEXTROSE-NACL 5-0.9 % IV SOLN: INTRAVENOUS | Status: DC

## 2020-09-07 MED ORDER — ACETAMINOPHEN 160 MG/5ML PO SOLN: 1000.0000 mg | Freq: Four times a day (QID) | ORAL | Status: DC

## 2020-09-07 MED ORDER — LACTATED RINGERS IV SOLN: INTRAVENOUS | Status: DC | PRN

## 2020-09-07 MED ORDER — PROPOFOL 10 MG/ML IV BOLUS
INTRAVENOUS | Status: AC
  Filled 2020-09-07: qty 20

## 2020-09-07 MED ORDER — 0.9 % SODIUM CHLORIDE (POUR BTL) OPTIME
TOPICAL | Status: DC | PRN
  Administered 2020-09-07: 1000 mL

## 2020-09-07 MED ORDER — PROPOFOL 10 MG/ML IV BOLUS
INTRAVENOUS | Status: DC | PRN
  Administered 2020-09-07: 100 mg via INTRAVENOUS

## 2020-09-07 MED ORDER — ALBUTEROL SULFATE (2.5 MG/3ML) 0.083% IN NEBU
2.5000 mg | INHALATION_SOLUTION | Freq: Three times a day (TID) | RESPIRATORY_TRACT | Status: DC
  Administered 2020-09-08: 2.5 mg via RESPIRATORY_TRACT
  Filled 2020-09-07: qty 3

## 2020-09-07 MED ORDER — ROCURONIUM BROMIDE 10 MG/ML (PF) SYRINGE
PREFILLED_SYRINGE | INTRAVENOUS | Status: DC | PRN
  Administered 2020-09-07: 50 mg via INTRAVENOUS
  Administered 2020-09-07: 20 mg via INTRAVENOUS

## 2020-09-07 MED ORDER — SENNOSIDES-DOCUSATE SODIUM 8.6-50 MG PO TABS
1.0000 | ORAL_TABLET | Freq: Every day | ORAL | Status: DC
  Administered 2020-09-07 – 2020-09-09 (×2): 1 via ORAL
  Filled 2020-09-07 (×3): qty 1

## 2020-09-07 MED ORDER — PROMETHAZINE HCL 25 MG/ML IJ SOLN: 6.2500 mg | INTRAMUSCULAR | Status: DC | PRN

## 2020-09-07 MED ORDER — ONDANSETRON HCL 4 MG/2ML IJ SOLN: 4.0000 mg | Freq: Four times a day (QID) | INTRAMUSCULAR | Status: DC | PRN

## 2020-09-07 MED ORDER — SUGAMMADEX SODIUM 200 MG/2ML IV SOLN
INTRAVENOUS | Status: DC | PRN
  Administered 2020-09-07: 400 mg via INTRAVENOUS

## 2020-09-07 MED ORDER — TRAMADOL HCL 50 MG PO TABS
50.0000 mg | ORAL_TABLET | Freq: Four times a day (QID) | ORAL | Status: DC | PRN
  Administered 2020-09-10 – 2020-09-12 (×5): 50 mg via ORAL
  Filled 2020-09-07 (×5): qty 1

## 2020-09-07 MED ORDER — FENTANYL CITRATE (PF) 250 MCG/5ML IJ SOLN
INTRAMUSCULAR | Status: AC
  Filled 2020-09-07: qty 5

## 2020-09-07 MED ORDER — MEPERIDINE HCL 25 MG/ML IJ SOLN: 6.2500 mg | INTRAMUSCULAR | Status: DC | PRN

## 2020-09-07 MED ORDER — HYDROMORPHONE HCL 1 MG/ML IJ SOLN
0.2500 mg | INTRAMUSCULAR | Status: DC | PRN
Start: 2020-09-07 — End: 2020-09-07

## 2020-09-07 MED ORDER — ENOXAPARIN SODIUM 40 MG/0.4ML ~~LOC~~ SOLN
40.0000 mg | Freq: Every day | SUBCUTANEOUS | Status: DC
  Administered 2020-09-08 – 2020-09-11 (×4): 40 mg via SUBCUTANEOUS
  Filled 2020-09-07 (×5): qty 0.4

## 2020-09-07 MED ORDER — ACETAMINOPHEN 500 MG PO TABS
1000.0000 mg | ORAL_TABLET | Freq: Four times a day (QID) | ORAL | Status: DC
  Administered 2020-09-08 – 2020-09-12 (×17): 1000 mg via ORAL
  Filled 2020-09-07 (×17): qty 2

## 2020-09-07 MED ORDER — MIDAZOLAM HCL 5 MG/5ML IJ SOLN
INTRAMUSCULAR | Status: DC | PRN
  Administered 2020-09-07: 2 mg via INTRAVENOUS

## 2020-09-07 MED ORDER — DEXMEDETOMIDINE (PRECEDEX) IN NS 20 MCG/5ML (4 MCG/ML) IV SYRINGE
PREFILLED_SYRINGE | INTRAVENOUS | Status: DC | PRN
  Administered 2020-09-07: 12 ug via INTRAVENOUS
  Administered 2020-09-07: 8 ug via INTRAVENOUS

## 2020-09-07 MED ORDER — HYDROMORPHONE HCL 1 MG/ML IJ SOLN
INTRAMUSCULAR | Status: AC
  Filled 2020-09-07: qty 1

## 2020-09-07 MED ORDER — DIPHENHYDRAMINE HCL 12.5 MG/5ML PO ELIX: 12.5000 mg | ORAL_SOLUTION | Freq: Four times a day (QID) | ORAL | Status: DC | PRN

## 2020-09-07 MED ORDER — PROMETHAZINE HCL 25 MG/ML IJ SOLN
6.2500 mg | INTRAMUSCULAR | Status: DC | PRN
Start: 2020-09-07 — End: 2020-09-07

## 2020-09-07 MED ORDER — SODIUM CHLORIDE 0.9 % IV SOLN
2.0000 g | Freq: Three times a day (TID) | INTRAVENOUS | Status: DC
  Administered 2020-09-08 – 2020-09-11 (×11): 2 g via INTRAVENOUS
  Filled 2020-09-07 (×12): qty 2

## 2020-09-07 MED ORDER — FENTANYL CITRATE (PF) 100 MCG/2ML IJ SOLN
INTRAMUSCULAR | Status: DC | PRN
  Administered 2020-09-07: 100 ug via INTRAVENOUS
  Administered 2020-09-07 (×3): 50 ug via INTRAVENOUS

## 2020-09-07 MED ORDER — KETAMINE HCL 50 MG/5ML IJ SOSY
PREFILLED_SYRINGE | INTRAMUSCULAR | Status: AC
  Filled 2020-09-07: qty 5

## 2020-09-07 MED ORDER — METRONIDAZOLE IN NACL 5-0.79 MG/ML-% IV SOLN
500.0000 mg | INTRAVENOUS | Status: DC
  Filled 2020-09-07: qty 100

## 2020-09-07 MED ORDER — MIDAZOLAM HCL 2 MG/2ML IJ SOLN
INTRAMUSCULAR | Status: AC
  Filled 2020-09-07: qty 2

## 2020-09-07 MED ORDER — ALBUTEROL SULFATE (2.5 MG/3ML) 0.083% IN NEBU
2.5000 mg | INHALATION_SOLUTION | RESPIRATORY_TRACT | Status: DC
  Administered 2020-09-07: 2.5 mg via RESPIRATORY_TRACT
  Filled 2020-09-07: qty 3

## 2020-09-07 MED ORDER — LIDOCAINE 2% (20 MG/ML) 5 ML SYRINGE
INTRAMUSCULAR | Status: DC | PRN
  Administered 2020-09-07: 100 mg via INTRAVENOUS

## 2020-09-07 MED ORDER — CHLORHEXIDINE GLUCONATE CLOTH 2 % EX PADS
6.0000 | MEDICATED_PAD | Freq: Every day | CUTANEOUS | Status: DC
  Administered 2020-09-08 – 2020-09-11 (×3): 6 via TOPICAL

## 2020-09-07 MED ORDER — PHENYLEPHRINE HCL-NACL 10-0.9 MG/250ML-% IV SOLN
INTRAVENOUS | Status: DC | PRN
  Administered 2020-09-07: 50 ug/min via INTRAVENOUS

## 2020-09-07 SURGICAL SUPPLY — 70 items
APPLICATOR TIP EXT COSEAL (VASCULAR PRODUCTS) IMPLANT
BLADE CLIPPER SURG (BLADE) IMPLANT
CANISTER SUCT 3000ML PPV (MISCELLANEOUS) ×3 IMPLANT
CATH KIT ON-Q SILVERSOAK 5IN (CATHETERS) IMPLANT
CATH THORACIC 28FR (CATHETERS) IMPLANT
CATH THORACIC 36FR (CATHETERS) IMPLANT
CATH THORACIC 36FR RT ANG (CATHETERS) IMPLANT
CLIP VESOCCLUDE MED 6/CT (CLIP) IMPLANT
CNTNR URN SCR LID CUP LEK RST (MISCELLANEOUS) ×4 IMPLANT
CONN ST 1/4X3/8  BEN (MISCELLANEOUS) ×2
CONN ST 1/4X3/8 BEN (MISCELLANEOUS) ×4 IMPLANT
CONN Y 3/8X3/8X3/8  BEN (MISCELLANEOUS) ×1
CONN Y 3/8X3/8X3/8 BEN (MISCELLANEOUS) ×2 IMPLANT
CONT SPEC 4OZ STRL OR WHT (MISCELLANEOUS) ×2
COVER SURGICAL LIGHT HANDLE (MISCELLANEOUS) IMPLANT
DERMABOND ADVANCED (GAUZE/BANDAGES/DRESSINGS)
DERMABOND ADVANCED .7 DNX12 (GAUZE/BANDAGES/DRESSINGS) IMPLANT
DRAPE LAPAROSCOPIC ABDOMINAL (DRAPES) ×3 IMPLANT
DRAPE WARM FLUID 44X44 (DRAPES) IMPLANT
DRSG AQUACEL AG ADV 3.5X 6 (GAUZE/BANDAGES/DRESSINGS) ×3 IMPLANT
ELECT BLADE 4.0 EZ CLEAN MEGAD (MISCELLANEOUS) ×3
ELECT REM PT RETURN 9FT ADLT (ELECTROSURGICAL) ×3
ELECTRODE BLDE 4.0 EZ CLN MEGD (MISCELLANEOUS) ×2 IMPLANT
ELECTRODE REM PT RTRN 9FT ADLT (ELECTROSURGICAL) ×2 IMPLANT
GAUZE SPONGE 4X4 12PLY STRL (GAUZE/BANDAGES/DRESSINGS) ×3 IMPLANT
GLOVE ECLIPSE 6.0 STRL STRAW (GLOVE) ×3 IMPLANT
GLOVE TRIUMPH SURG SIZE 7.0 (KITS) ×3 IMPLANT
GOWN STRL REUS W/ TWL LRG LVL3 (GOWN DISPOSABLE) ×2 IMPLANT
GOWN STRL REUS W/ TWL XL LVL3 (GOWN DISPOSABLE) ×6 IMPLANT
GOWN STRL REUS W/TWL LRG LVL3 (GOWN DISPOSABLE) ×1
GOWN STRL REUS W/TWL XL LVL3 (GOWN DISPOSABLE) ×3
HEMOSTAT SURGICEL 2X14 (HEMOSTASIS) IMPLANT
KIT BASIN OR (CUSTOM PROCEDURE TRAY) ×3 IMPLANT
KIT TURNOVER KIT B (KITS) ×3 IMPLANT
NS IRRIG 1000ML POUR BTL (IV SOLUTION) ×3 IMPLANT
PACK CHEST (CUSTOM PROCEDURE TRAY) ×3 IMPLANT
PAD ARMBOARD 7.5X6 YLW CONV (MISCELLANEOUS) ×6 IMPLANT
SEALANT SURG COSEAL 4ML (VASCULAR PRODUCTS) IMPLANT
SEALANT SURG COSEAL 8ML (VASCULAR PRODUCTS) IMPLANT
SOL ANTI FOG 6CC (MISCELLANEOUS) ×2 IMPLANT
SOLUTION ANTI FOG 6CC (MISCELLANEOUS) ×1
STAPLER VISISTAT 35W (STAPLE) ×3 IMPLANT
SUT PROLENE 3 0 SH DA (SUTURE) IMPLANT
SUT PROLENE 4 0 RB 1 (SUTURE)
SUT PROLENE 4-0 RB1 .5 CRCL 36 (SUTURE) IMPLANT
SUT SILK  1 MH (SUTURE) ×2
SUT SILK 1 MH (SUTURE) ×4 IMPLANT
SUT SILK 1 TIES 10X30 (SUTURE) IMPLANT
SUT SILK 2 0 SH (SUTURE) IMPLANT
SUT SILK 2 0SH CR/8 30 (SUTURE) IMPLANT
SUT VIC AB 1 CTX 18 (SUTURE) ×3 IMPLANT
SUT VIC AB 1 CTX 36 (SUTURE) ×1
SUT VIC AB 1 CTX36XBRD ANBCTR (SUTURE) ×2 IMPLANT
SUT VIC AB 2-0 CT1 27 (SUTURE) ×1
SUT VIC AB 2-0 CT1 TAPERPNT 27 (SUTURE) ×2 IMPLANT
SUT VIC AB 2-0 CTX 36 (SUTURE) IMPLANT
SUT VIC AB 2-0 UR6 27 (SUTURE) IMPLANT
SUT VIC AB 3-0 MH 27 (SUTURE) IMPLANT
SUT VIC AB 3-0 X1 27 (SUTURE) ×3 IMPLANT
SUT VICRYL 2 TP 1 (SUTURE) ×3 IMPLANT
SWAB COLLECTION DEVICE MRSA (MISCELLANEOUS) IMPLANT
SWAB CULTURE ESWAB REG 1ML (MISCELLANEOUS) IMPLANT
SYSTEM SAHARA CHEST DRAIN ATS (WOUND CARE) ×3 IMPLANT
TAPE CLOTH 4X10 WHT NS (GAUZE/BANDAGES/DRESSINGS) ×3 IMPLANT
TIP APPLICATOR SPRAY EXTEND 16 (VASCULAR PRODUCTS) IMPLANT
TOWEL GREEN STERILE (TOWEL DISPOSABLE) ×3 IMPLANT
TOWEL GREEN STERILE FF (TOWEL DISPOSABLE) ×3 IMPLANT
TRAP SPECIMEN MUCUS 40CC (MISCELLANEOUS) ×3 IMPLANT
TRAY FOLEY MTR SLVR 14FR STAT (SET/KITS/TRAYS/PACK) ×3 IMPLANT
WATER STERILE IRR 1000ML POUR (IV SOLUTION) ×3 IMPLANT

## 2020-09-07 NOTE — Anesthesia Preprocedure Evaluation (Addendum)
Anesthesia Evaluation  Patient identified by MRN, date of birth, ID band Patient awake    Reviewed: Allergy & Precautions, NPO status , Patient's Chart, lab work & pertinent test results  Airway Mallampati: II  TM Distance: >3 FB Neck ROM: Full    Dental  (+) Dental Advisory Given, Poor Dentition, Loose, Chipped, Missing   Pulmonary asthma , pneumonia, Current Smoker and Patient abstained from smoking.,    Pulmonary exam normal breath sounds clear to auscultation       Cardiovascular hypertension, Pt. on medications Normal cardiovascular exam Rhythm:Regular Rate:Normal     Neuro/Psych PSYCHIATRIC DISORDERS Anxiety Depression negative neurological ROS     GI/Hepatic GERD  ,(+) Hepatitis -  Endo/Other  negative endocrine ROS  Renal/GU negative Renal ROS     Musculoskeletal  (+) Arthritis ,   Abdominal   Peds  Hematology negative hematology ROS (+)   Anesthesia Other Findings   Reproductive/Obstetrics                            Anesthesia Physical Anesthesia Plan  ASA: III  Anesthesia Plan: General   Post-op Pain Management:    Induction: Intravenous  PONV Risk Score and Plan: 3 and Ondansetron, Dexamethasone, Treatment may vary due to age or medical condition and Midazolam  Airway Management Planned: Oral ETT and Double Lumen EBT  Additional Equipment: Arterial line  Intra-op Plan:   Post-operative Plan: Possible Post-op intubation/ventilation  Informed Consent: I have reviewed the patients History and Physical, chart, labs and discussed the procedure including the risks, benefits and alternatives for the proposed anesthesia with the patient or authorized representative who has indicated his/her understanding and acceptance.     Dental advisory given  Plan Discussed with: CRNA  Anesthesia Plan Comments: (2 x  PIV)      Anesthesia Quick Evaluation

## 2020-09-07 NOTE — Plan of Care (Signed)

## 2020-09-07 NOTE — Anesthesia Procedure Notes (Addendum)
Arterial Line Insertion Start/End2/05/2021 12:30 PM, 09/07/2020 12:55 PM Performed by: Lewie Loron, MD, Aundria Rud, CRNA, anesthesiologist  Patient location: Pre-op. Preanesthetic checklist: patient identified, IV checked, site marked, risks and benefits discussed, surgical consent, monitors and equipment checked, pre-op evaluation, timeout performed and anesthesia consent Lidocaine 1% used for infiltration Right, radial was placed Catheter size: 20 G Hand hygiene performed  and maximum sterile barriers used  Allen's test indicative of satisfactory collateral circulation Attempts: 3 Procedure performed using ultrasound guided technique. Ultrasound Notes:anatomy identified, needle tip was noted to be adjacent to the nerve/plexus identified, no ultrasound evidence of intravascular and/or intraneural injection and image(s) printed for medical record Following insertion, dressing applied and Biopatch. Post procedure assessment: normal and unchanged  Patient tolerated the procedure well with no immediate complications.

## 2020-09-07 NOTE — Anesthesia Procedure Notes (Signed)
Procedure Name: Intubation Date/Time: 09/07/2020 1:39 PM Performed by: Kyung Rudd, CRNA Pre-anesthesia Checklist: Patient identified, Emergency Drugs available, Suction available and Patient being monitored Patient Re-evaluated:Patient Re-evaluated prior to induction Oxygen Delivery Method: Circle system utilized Preoxygenation: Pre-oxygenation with 100% oxygen Induction Type: IV induction Ventilation: Mask ventilation without difficulty Laryngoscope Size: Mac and 3 Grade View: Grade I Tube type: Oral Endobronchial tube: Left, Double lumen EBT, EBT position confirmed by auscultation and EBT position confirmed by fiberoptic bronchoscope and 37 Fr Number of attempts: 1 Placement Confirmation: ETT inserted through vocal cords under direct vision,  positive ETCO2 and breath sounds checked- equal and bilateral Tube secured with: Tape Dental Injury: Teeth and Oropharynx as per pre-operative assessment

## 2020-09-07 NOTE — Progress Notes (Signed)
PROGRESS NOTE  Donna Phelps PZW:[id number] DOB: January 06, 1961   PCP: Abram Sander, MD  Patient is from: Home  DOA: 09/06/2020 LOS: 1  Chief complaints: Chest pain, shortness of breath, fatigue and cough  Brief Narrative / Interim history: 60 year old female with history of HTN, asthma, hep C s/p Harvoni, anxiety, depression, tobacco use disorder, GERD and recent hospitalization from 1/28-1/29 for right THA presented to [facility] with the above complaints and found to have extensive loculated pleural effusion consistent with empyema likely from pneumonia.  She was septic on admission.  She was admitted to [facility] with sepsis due to pneumonia/left empyema for IV antibiotics and VATS procedure.  Started on IV ceftriaxone, azithromycin and IV Flagyl.   Given her recent hospitalization, change IV ceftriaxone to IV cefepime.  Her MRSA PCR screen is negative.  Azithromycin and IV Flagyl.  Ordered sputum culture.   Subjective: Seen and examined earlier this morning.  No major events overnight of this morning.  She complains left-sided chest pain and left-sided back pain.  Describes the pain as sharp.  Usually with breathing and cough.  Breathing improved.  Still with cough.  Cough is dry and painful.  She denies hemoptysis.  Denies GI or UTI symptoms.  Objective: Vitals:   09/06/20 1954 09/07/20 0010 09/07/20 0612 09/07/20 0905  BP: 120/84 117/72 120/70 131/71  Pulse: 70 (!) 43  65  Resp: 18  18 18   Temp: 98.8 F (37.1 C) 98.7 F (37.1 C) 98.7 F (37.1 C) 99.3 F (37.4 C)  TempSrc: Oral Oral Oral Oral  SpO2: 100% 98%  97%  Weight:      Height:        Intake/Output Summary (Last 24 hours) at 09/07/2020 1113 Last data filed at 09/06/2020 1508 Gross per 24 hour  Intake 500 ml  Output -  Net 500 ml   Filed Weights   09/06/20 1100  Weight: 75.8 kg    Examination:  GENERAL: No apparent distress.  Nontoxic. HEENT: MMM.  Vision and hearing grossly intact.  NECK: Supple.  No apparent  JVD.  RESP: 98% on 2 L.  No IWOB.  Crackles on the left.  Diminished aeration bilaterally partly due to poor respiratory effort. CVS:  RRR. Heart sounds normal.  ABD/GI/GU: BS+. Abd soft, NTND.  MSK/EXT:  Moves extremities. No apparent deformity. No edema.  SKIN: no apparent skin lesion or wound NEURO: Awake, alert and oriented appropriately.  No apparent focal neuro deficit. PSYCH: Calm. Normal affect.   Procedures:  None  Microbiology summarized: 2/9-blood cultures NGTD. 2/11-MRSA PCR negative  Assessment & Plan: Sepsis due to pneumonia and left empyema: POA.  Has fever, tachycardia, tachypnea and leukocytosis.  Blood culture NGTD. -Plan for VATS procedure by cardiothoracic surgery today -Change ceftriaxone to cefepime given recent hospitalization -Continue IV Flagyl and azithromycin -Given negative MRSA PCR-she may not need vancomycin -Follow sputum culture and pleural fluid culture. -Pain control, incentive spirometry and flutter valve -Bowel regimen.  Atypical chest pain: Likely due to the above. -Management as above  PVCs: Noted to have intermittent PVCs on telemetry.  Likely due to hypokalemia. -Optimize electrolytes -Continue telemetry monitoring -Follow TTE  Essential hypertension: Normotensive.  On lisinopril/HCTZ at home. -Hold home lisinopril/HCTZ.  Hypokalemia: Likely due to HCTZ. -Hold HCTZ -Replenish and recheck -Check magnesium  Recent right THA-stable. -outpatient follow-up -PT/OT eval  Tobacco use disorder: Over 44-pack-year history and quit 2 weeks ago likely for hip replacement surgery.  Nutritional status: Prealbumin 5.9 -Consult dietitian Body mass  index is 26.95 kg/m.         DVT prophylaxis:  Will start subcu Lovenox after surgery  Code Status: Full code Family Communication: Patient and/or RN.  Attempted to call patient's son, Lyn Hollingshead but no answer.  Did not leave voicemail. Level of care: Telemetry Medical Status is:  Inpatient  Remains inpatient appropriate because:Ongoing diagnostic testing needed not appropriate for outpatient work up, Unsafe d/c plan, IV treatments appropriate due to intensity of illness or inability to take PO and Inpatient level of care appropriate due to severity of illness   Dispo: The patient is from: Home              Anticipated d/c is to: Home              Anticipated d/c date is: > 3 days              Patient currently is not medically stable to d/c.   Difficult to place patient No       Consultants:  Cardiothoracic surgery   Sch Meds:  Scheduled Meds: . azithromycin  500 mg Oral Daily  . sodium chloride flush  3 mL Intravenous Q12H   Continuous Infusions: . cefTRIAXone (ROCEPHIN)  IV 2 g (09/06/20 1639)  . metronidazole Stopped (09/07/20 0506)   PRN Meds:.acetaminophen **OR** acetaminophen, ipratropium-albuterol, morphine injection, ondansetron **OR** ondansetron (ZOFRAN) IV, oxyCODONE  Antimicrobials: Anti-infectives (From admission, onward)   Start     Dose/Rate Route Frequency Ordered Stop   09/06/20 1800  cefTRIAXone (ROCEPHIN) 2 g in sodium chloride 0.9 % 100 mL IVPB        2 g 200 mL/hr over 30 Minutes Intravenous Every 24 hours 09/06/20 1036     09/06/20 1615  azithromycin (ZITHROMAX) tablet 500 mg        500 mg Oral Daily 09/06/20 1521 09/10/20 0959   09/06/20 1300  metroNIDAZOLE (FLAGYL) IVPB 500 mg        500 mg 100 mL/hr over 60 Minutes Intravenous Every 8 hours 09/06/20 1036         I have personally reviewed the following labs and images: CBC: Recent Labs  Lab 09/05/20 1312 09/06/20 1058 09/07/20 0029  WBC 17.9* 16.4* 15.4*  NEUTROABS  --  13.1*  --   HGB 12.6 11.7* 10.6*  HCT 39.2 36.8 32.3*  MCV 85.4 85.4 85.4  PLT 242 225 234   BMP &GFR Recent Labs  Lab 09/05/20 1312 09/06/20 1058 09/07/20 0029  NA 135 137 136  K 3.4* 3.2* 3.6  CL 98 101 100  CO2 24 26 26   GLUCOSE 114* 113* 108*  BUN 16 11 10   CREATININE 0.67  0.74 0.70  CALCIUM 8.9 8.4* 8.4*  MG  --  1.7  --    Estimated Creatinine Clearance: 76.8 mL/min (by C-G formula based on SCr of 0.7 mg/dL). Liver & Pancreas: Recent Labs  Lab 09/06/20 1058  AST 22  ALT 19  ALKPHOS 61  BILITOT 0.5  PROT 6.9  ALBUMIN 2.6*   No results for input(s): LIPASE, AMYLASE in the last 168 hours. No results for input(s): AMMONIA in the last 168 hours. Diabetic: No results for input(s): HGBA1C in the last 72 hours. No results for input(s): GLUCAP in the last 168 hours. Cardiac Enzymes: No results for input(s): CKTOTAL, CKMB, CKMBINDEX, TROPONINI in the last 168 hours. No results for input(s): PROBNP in the last 8760 hours. Coagulation Profile: No results for input(s): INR, PROTIME in the last 168  hours. Thyroid Function Tests: No results for input(s): TSH, T4TOTAL, FREET4, T3FREE, THYROIDAB in the last 72 hours. Lipid Profile: No results for input(s): CHOL, HDL, LDLCALC, TRIG, CHOLHDL, LDLDIRECT in the last 72 hours. Anemia Panel: No results for input(s): VITAMINB12, FOLATE, FERRITIN, TIBC, IRON, RETICCTPCT in the last 72 hours. Urine analysis:    Component Value Date/Time   COLORURINE YELLOW (A) 08/23/2019 1309   APPEARANCEUR HAZY (A) 08/23/2019 1309   LABSPEC 1.019 08/23/2019 1309   PHURINE 6.0 08/23/2019 1309   GLUCOSEU NEGATIVE 08/23/2019 1309   HGBUR NEGATIVE 08/23/2019 1309   BILIRUBINUR NEGATIVE 08/23/2019 1309   KETONESUR NEGATIVE 08/23/2019 1309   PROTEINUR NEGATIVE 08/23/2019 1309   NITRITE NEGATIVE 08/23/2019 1309   LEUKOCYTESUR SMALL (A) 08/23/2019 1309   Sepsis Labs: Invalid input(s): PROCALCITONIN, LACTICIDVEN  Microbiology: Recent Results (from the past 240 hour(s))  Blood culture (routine x 2)     Status: None (Preliminary result)   Collection Time: 09/05/20  2:30 PM   Specimen: BLOOD  Result Value Ref Range Status   Specimen Description BLOOD BLOOD LEFT FOREARM  Final   Special Requests   Final    BOTTLES DRAWN AEROBIC  AND ANAEROBIC Blood Culture adequate volume   Culture   Final    NO GROWTH 2 DAYS Performed at [facility], [address] [place], [place] [address]    Report Status PENDING  Incomplete  Blood culture (routine x 2)     Status: None (Preliminary result)   Collection Time: 09/05/20  2:45 PM   Specimen: BLOOD  Result Value Ref Range Status   Specimen Description BLOOD LEFT ANTECUBITAL  Final   Special Requests   Final    BOTTLES DRAWN AEROBIC AND ANAEROBIC Blood Culture results may not be optimal due to an excessive volume of blood received in culture bottles   Culture   Final    NO GROWTH 2 DAYS Performed at [facility], [address], [place], [place] [address]    Report Status PENDING  Incomplete  Resp Panel by RT-PCR (Flu A&B, Covid) Nasopharyngeal Swab     Status: None   Collection Time: 09/05/20  4:25 PM   Specimen: Nasopharyngeal Swab; Nasopharyngeal(NP) swabs in vial transport medium  Result Value Ref Range Status   SARS Coronavirus 2 by RT PCR NEGATIVE NEGATIVE Final    Comment: (NOTE) SARS-CoV-2 target nucleic acids are NOT DETECTED.  The SARS-CoV-2 RNA is generally detectable in upper respiratory specimens during the acute phase of infection. The lowest concentration of SARS-CoV-2 viral copies this assay can detect is 138 copies/mL. A negative result does not preclude SARS-Cov-2 infection and should not be used as the sole basis for treatment or other patient management decisions. A negative result may occur with  improper specimen collection/handling, submission of specimen other than nasopharyngeal swab, presence of viral mutation(s) within the areas targeted by this assay, and inadequate number of viral copies(<138 copies/mL). A negative result must be combined with clinical observations, patient history, and epidemiological information. The expected result is Negative.  Fact Sheet for Patients:   BloggerCourse.com  Fact Sheet for Healthcare Providers:  SeriousBroker.it  This test is no t yet approved or cleared by the [place] FDA and  has been authorized for detection and/or diagnosis of SARS-CoV-2 by FDA under an Emergency Use Authorization (EUA). This EUA will remain  in effect (meaning this test can be used) for the duration of the COVID-19 declaration under Section 564(b)(1) of the Act, 21 U.S.C.section 360bbb-3(b)(1),  unless the authorization is terminated  or revoked sooner.       Influenza A by PCR NEGATIVE NEGATIVE Final   Influenza B by PCR NEGATIVE NEGATIVE Final    Comment: (NOTE) The Xpert Xpress SARS-CoV-2/FLU/RSV plus assay is intended as an aid in the diagnosis of influenza from Nasopharyngeal swab specimens and should not be used as a sole basis for treatment. Nasal washings and aspirates are unacceptable for Xpert Xpress SARS-CoV-2/FLU/RSV testing.  Fact Sheet for Patients: BloggerCourse.com  Fact Sheet for Healthcare Providers: SeriousBroker.it  This test is not yet approved or cleared by the [place] FDA and has been authorized for detection and/or diagnosis of SARS-CoV-2 by FDA under an Emergency Use Authorization (EUA). This EUA will remain in effect (meaning this test can be used) for the duration of the COVID-19 declaration under Section 564(b)(1) of the Act, 21 U.S.C. section 360bbb-3(b)(1), unless the authorization is terminated or revoked.  Performed at [facility], [address], [place], [place] [address]     Radiology Studies: No results found.    Taye T. Gonfa [facility]  If 7PM-7AM, please contact night-coverage [email or web address] 09/07/2020, 11:13 AM

## 2020-09-07 NOTE — Op Note (Signed)
09/07/2020 Donna Phelps [id number]  Surgeon: Alleen Borne, MD   First Assistant: RNFA  Preoperative Diagnosis: Left empyema   Postoperative Diagnosis: Left empyema   Procedure:  1. Left thoracotomy  2. Drainage of empyema  3. Decortication of the left lung   Anesthesia: General Endotracheal   Clinical History/Surgical Indication:   This patient has an extensive left empyema with left lower lobe and partial left upper lobe collapse and likely pneumonia.  She has bad teeth and a smoking history both of which increase the risk of this.  This will require drainage by VATS and likely thoracotomy.  I discussed the operative procedure with the patient and her son by telephone.  I discussed alternatives, benefits, and risk including but not limited to bleeding, blood transfusion, infection, injury to the lung, persistent pleural space problems, and respiratory failure.  They understand agree to proceed.   Preparation:  The patient was seen in the preoperative holding area and the correct patient, correct operation, correct operative sidewere confirmed with the patient after reviewing the medical record and CT scan. The consent was signed by me. Preoperative antibiotics were given. The left side of the chest was signed by me. The patient was taken back to the operating room and positioned supine on the operating room table. After being placed under general endotracheal anesthesia by the anesthesia team using a double lumen tube a foley catheter was placed. The patient was turned into the right lateral decubitus position. The chest was prepped with betadine soap and solution. A surgical time-out was taken and the correct patient,operative side, and operative procedure were confirmed with the nursing and anesthesia staff.   Operative Procedure:  A short lateral thoracotomy incision was made and the chest entered through the 7th ICS. The pleural space was filled with a yellowish green  multiloculated empyema that was sero-purulent with a thick fibrinous peel over the lung. The empyema was completely drained and the left lung decorticated. This allowed complete expansion of the lung. The chest was irrigated with warm saline. Hemostasis was complete. Two 20 F Bard drains were placed through separate stab incisions and were positioned posteriorly and inferiorly in the pleural space. The ribs were reapproximated with # 2 vicryl pericostal sutures and the muscles closed with continuous 0 vicryl suture. The subcutaneous tissue was closed with 2-0 vicryl continuous suture. The skin was closed with staples.  All sponge, needle, and instrument counts were reported correct at the end of the case. Dry sterile dressings were placed over the incisions and around the chest tubes which were connected to pleurevac suction. The patient was turned supine, extubated,then transported to the PACU in satisfactory and stable condition.

## 2020-09-07 NOTE — CV Procedure (Signed)
Echocardiogram not completed, patient is currently undergoing other testing.  Reganne Messerschmidt RDCS 

## 2020-09-07 NOTE — Brief Op Note (Signed)
09/07/2020  3:12 PM  PATIENT:  Mehlani Blankenburg  60 y.o. female  PRE-OPERATIVE DIAGNOSIS:  LEFT EMPYEMA  POST-OPERATIVE DIAGNOSIS:  LEFT EMPYEMA  PROCEDURE:  Procedure(s): Left Thoracotomy EMPYEMA DRAINAGE (Left) DECORTICATION (Left)  SURGEON:  Surgeon(s) and Role:    * Nyeli Holtmeyer, Payton Doughty, MD - Primary  PHYSICIAN ASSISTANT: none  ASSISTANTS: RNFA  ANESTHESIA:   general  EBL:  30 mL   BLOOD ADMINISTERED:none  DRAINS: (two 34F) Blake drain(s) in the left pleural space   LOCAL MEDICATIONS USED:  NONE  SPECIMEN:  Source of Specimen:  left pleural peel  DISPOSITION OF SPECIMEN: Micro  COUNTS:  YES  TOURNIQUET:  * No tourniquets in log *  DICTATION: .Note written in EPIC  PLAN OF CARE: Admit to inpatient   PATIENT DISPOSITION:  PACU - hemodynamically stable.   Delay start of Pharmacological VTE agent (>24hrs) due to surgical blood loss or risk of bleeding: yes

## 2020-09-07 NOTE — Transfer of Care (Signed)
Immediate Anesthesia Transfer of Care Note  Patient: Donna Phelps  Procedure(s) Performed: VIDEO ASSISTED THORACOSCOPY (VATS)/THOROCOTOMY (Left Chest) EMPYEMA DRAINAGE (Left ) DECORTICATION (Left )  Patient Location: PACU  Anesthesia Type:General  Level of Consciousness: awake and alert   Airway & Oxygen Therapy: Patient Spontanous Breathing and Patient connected to face mask oxygen  Post-op Assessment: Report given to RN and Post -op Vital signs reviewed and stable  Post vital signs: Reviewed and stable  Last Vitals:  Vitals Value Taken Time  BP 162/83 09/07/20 1535  Temp    Pulse 83 09/07/20 1537  Resp 25 09/07/20 1537  SpO2 87 % 09/07/20 1537  Vitals shown include unvalidated device data.  Last Pain:  Vitals:   09/07/20 1132  TempSrc:   PainSc: 0-No pain      Patients Stated Pain Goal: 2 (09/07/20 0604)  Complications: No complications documented.

## 2020-09-07 NOTE — Progress Notes (Signed)
Pharmacy Antibiotic Note  Donna Phelps is a 60 y.o. female presented to [facility] on 09/05/20 with SOB, found to have extensive loculated pleural effusion with collapse of the left lung.  Patient transferred to [facility] on 09/06/20 and underwent left thoracotomy, decortication with drainage on 09/07/20.   Pharmacy has been consulted for cefepime dosing.  Patient is also on Flagyl and azithromycin.  Renal function stable, afebrile, WBC 15.4.  Plan:  Cefepime 2gm IV Q8H Azith 500mg  IV Q24H and Flagyl 500mg  IV Q8H per MD Monitor renal fxn, clinical progress  Height: 5\' 6"  (167.6 cm) Weight: 75.8 kg (167 lb) IBW/kg (Calculated) : 59.3  Temp (24hrs), Avg:98.9 F (37.2 C), Min:98.7 F (37.1 C), Max:99.3 F (37.4 C)  Recent Labs  Lab 09/05/20 1312 09/06/20 1058 09/07/20 0029  WBC 17.9* 16.4* 15.4*  CREATININE 0.67 0.74 0.70  LATICACIDVEN 1.1  --   --     Estimated Creatinine Clearance: 76.8 mL/min (by C-G formula based on SCr of 0.7 mg/dL).    No Known Allergies  Cefepime 2/11 >> Flagyl 2/11 >> Azith 2/11 >>   2/11 surgical PCR - negative 2/11 sputum -   Yoshito Gaza D. 4/11, PharmD, BCPS, BCCCP 09/07/2020, 3:55 PM

## 2020-09-08 ENCOUNTER — Inpatient Hospital Stay (HOSPITAL_COMMUNITY): Payer: Medicaid Other

## 2020-09-08 DIAGNOSIS — J869 Pyothorax without fistula: Secondary | ICD-10-CM | POA: Diagnosis not present

## 2020-09-08 LAB — GLUCOSE, CAPILLARY
Glucose-Capillary: 131 mg/dL — ABNORMAL HIGH (ref 70–99)
Glucose-Capillary: 149 mg/dL — ABNORMAL HIGH (ref 70–99)
Glucose-Capillary: 111 mg/dL — ABNORMAL HIGH (ref 70–99)
Glucose-Capillary: 123 mg/dL — ABNORMAL HIGH (ref 70–99)
Glucose-Capillary: 123 mg/dL — ABNORMAL HIGH (ref 70–99)
Glucose-Capillary: 116 mg/dL — ABNORMAL HIGH (ref 70–99)

## 2020-09-08 LAB — BLOOD GAS, ARTERIAL
Acid-Base Excess: 3.6 mmol/L — ABNORMAL HIGH (ref 0.0–2.0)
Bicarbonate: 28.4 mmol/L — ABNORMAL HIGH (ref 20.0–28.0)
Drawn by: 55062
FIO2: 32
O2 Saturation: 96.7 %
Patient temperature: 36.7
pCO2 arterial: 48.5 mmHg — ABNORMAL HIGH (ref 32.0–48.0)
pH, Arterial: 7.383 (ref 7.350–7.450)
pO2, Arterial: 90.4 mmHg (ref 83.0–108.0)

## 2020-09-08 LAB — CBC
HCT: 32.4 % — ABNORMAL LOW (ref 36.0–46.0)
Hemoglobin: 10.3 g/dL — ABNORMAL LOW (ref 12.0–15.0)
MCH: 27.5 pg (ref 26.0–34.0)
MCHC: 31.8 g/dL (ref 30.0–36.0)
MCV: 86.6 fL (ref 80.0–100.0)
Platelets: 256 10*3/uL (ref 150–400)
RBC: 3.74 MIL/uL — ABNORMAL LOW (ref 3.87–5.11)
RDW: 15.9 % — ABNORMAL HIGH (ref 11.5–15.5)
WBC: 15.9 10*3/uL — ABNORMAL HIGH (ref 4.0–10.5)
nRBC: 0 % (ref 0.0–0.2)

## 2020-09-08 LAB — RENAL FUNCTION PANEL
Albumin: 2.2 g/dL — ABNORMAL LOW (ref 3.5–5.0)
Anion gap: 11 (ref 5–15)
BUN: 14 mg/dL (ref 8–23)
CO2: 26 mmol/L (ref 22–32)
Calcium: 8.7 mg/dL — ABNORMAL LOW (ref 8.9–10.3)
Chloride: 99 mmol/L (ref 98–111)
Creatinine, Ser: 0.63 mg/dL (ref 0.44–1.00)
GFR, Estimated: >60 mL/min (ref >60)
Glucose, Bld: 145 mg/dL — ABNORMAL HIGH (ref 70–99)
Phosphorus: 3.1 mg/dL (ref 2.5–4.6)
Potassium: 3.9 mmol/L (ref 3.5–5.1)
Sodium: 136 mmol/L (ref 135–145)

## 2020-09-08 LAB — MAGNESIUM: Magnesium: 2.1 mg/dL (ref 1.7–2.4)

## 2020-09-08 MED ORDER — ALBUTEROL SULFATE (2.5 MG/3ML) 0.083% IN NEBU: 2.5000 mg | INHALATION_SOLUTION | Freq: Four times a day (QID) | RESPIRATORY_TRACT | Status: DC | PRN

## 2020-09-08 MED ORDER — WHITE PETROLATUM EX OINT
TOPICAL_OINTMENT | CUTANEOUS | Status: AC
  Filled 2020-09-08: qty 28.35

## 2020-09-08 NOTE — Progress Notes (Signed)
PROGRESS NOTE    Donna Phelps  NWG:[id number]  DOB: 1961-04-18  DOA: 09/06/2020 PCP: Abram SanderAdamo, Elena M, MD Outpatient Specialists:   Hospital course:  60 year old female with history of HTN, asthma, hep C s/p Harvoni, anxiety, depression, tobacco use disorder, GERD and recent hospitalization from 1/28-1/29 for right THA presented [facility] with the above complaints and found to have extensive loculated pleural effusion consistent with empyema likely from pneumonia.  She was septic on admission.  [facility] due to pneumonia/left empyema for IV antibiotics and VATS procedure.   Initially started on IV ceftriaxone, azithromycin and IV Flagyl changed to cefepime, azithromycin and Flagyl given recent hospitalization. MRSA PCR negative so vancomycin was discontinued.  Subjective:  Patient states she feels so much better after her VATS procedure yesterday.  Notes that she can walk again without too much shortness of breath.  Notes that she was able to walk to the bathroom.  Notes pain is well controlled.  Cough is much improved.  Objective: Vitals:   09/08/20 0739 09/08/20 0800 09/08/20 1037 09/08/20 1200  BP:  112/71 124/87 120/84  Pulse:  69 66 72  Resp:  20 13 17   Temp:  97.9 F (36.6 C) 98.3 F (36.8 C) 98 F (36.7 C)  TempSrc:  Oral Oral Oral  SpO2: 100% 93% 97% 97%  Weight:      Height:        Intake/Output Summary (Last 24 hours) at 09/08/2020 1247 Last data filed at 09/08/2020 [id number] Gross per 24 hour  Intake 1874.89 ml  Output 965 ml  Net 909.89 ml   Filed Weights   09/06/20 1100 09/07/20 1132  Weight: 75.8 kg 75.8 kg     Exam:  General: Markedly well-appearing female in excellent spirits sitting up in recliner in no acute distress. Eyes: sclera anicteric, conjuctiva mild injection bilaterally CVS: S1-S2, regular  Respiratory: Reasonable air entry left upper lung field, decreased air entry lower lung fields bilaterally.   GI: NABS,  soft, NT  LE: No edema.  Neuro: A/O x 3, Moving all extremities equally with normal strength, CN 3-12 intact, grossly nonfocal.  Psych: patient is logical and coherent, judgement and insight appear normal, mood and affect appropriate to situation.   Assessment & Plan:   60 year old female with empyema status post VATS procedure with decortication yesterday.  Left empyema Feels much better after VATS procedure yesterday--notes decreased DOE and decreased malaise. Sepsis has resolved MRSA PCR is negative Blood cultures no growth to date Continue cefepime, Flagyl and azithromycin  HTN Can restart HCTZ/lisinopril once her blood pressure has rebounded  Status post right THA 08/24/2020 Continue PT and OT once patient is able to participate in therapy    DVT prophylaxis: Lovenox Code Status: Full Family Communication: None Disposition Plan:   Patient is from: Home  Anticipated Discharge Location: Home  Barriers to Discharge: Still acutely ill s/p VATS procedure yesterday  Is patient medically stable for Discharge: No   Consultants:  CT surgery  Procedures:  Status post VATS procedure 09/07/2020  Antimicrobials:  Cefepime  Flagyl  Azithromycin   Data Reviewed:  Basic Metabolic Panel: Recent Labs  Lab 09/05/20 1312 09/06/20 1058 09/07/20 0029 09/08/20 0355  NA 135 137 136 136  K 3.4* 3.2* 3.6 3.9  CL 98 101 100 99  CO2 24 26 26 26   GLUCOSE 114* 113* 108* 145*  BUN 16 11 10 14   CREATININE 0.67 0.74 0.70 0.63  CALCIUM 8.9 8.4*  8.4* 8.7*  MG  --  1.7  --  2.1  PHOS  --   --   --  3.1   Liver Function Tests: Recent Labs  Lab 09/06/20 1058 09/08/20 0355  AST 22  --   ALT 19  --   ALKPHOS 61  --   BILITOT 0.5  --   PROT 6.9  --   ALBUMIN 2.6* 2.2*   No results for input(s): LIPASE, AMYLASE in the last 168 hours. No results for input(s): AMMONIA in the last 168 hours. CBC: Recent Labs  Lab 09/05/20 1312 09/06/20 1058 09/07/20 0029  09/08/20 0355  WBC 17.9* 16.4* 15.4* 15.9*  NEUTROABS  --  13.1*  --   --   HGB 12.6 11.7* 10.6* 10.3*  HCT 39.2 36.8 32.3* 32.4*  MCV 85.4 85.4 85.4 86.6  PLT 242 225 234 256   Cardiac Enzymes: No results for input(s): CKTOTAL, CKMB, CKMBINDEX, TROPONINI in the last 168 hours. BNP (last 3 results) No results for input(s): PROBNP in the last 8760 hours. CBG: Recent Labs  Lab 09/07/20 2021 09/07/20 2340 09/08/20 0315 09/08/20 0857 09/08/20 1036  GLUCAP 115* 134* 131* 149* 111*    Recent Results (from the past 240 hour(s))  Blood culture (routine x 2)     Status: None (Preliminary result)   Collection Time: 09/05/20  2:30 PM   Specimen: BLOOD  Result Value Ref Range Status   Specimen Description BLOOD BLOOD LEFT FOREARM  Final   Special Requests   Final    BOTTLES DRAWN AEROBIC AND ANAEROBIC Blood Culture adequate volume   Culture   Final    NO GROWTH 3 DAYS Performed at [facility], [address], [place], [place] [address]    Report Status PENDING  Incomplete  Blood culture (routine x 2)     Status: None (Preliminary result)   Collection Time: 09/05/20  2:45 PM   Specimen: BLOOD  Result Value Ref Range Status   Specimen Description BLOOD LEFT ANTECUBITAL  Final   Special Requests   Final    BOTTLES DRAWN AEROBIC AND ANAEROBIC Blood Culture results may not be optimal due to an excessive volume of blood received in culture bottles   Culture   Final    NO GROWTH 3 DAYS Performed at [facility], [address], [place], [place] [address]    Report Status PENDING  Incomplete  Resp Panel by RT-PCR (Flu A&B, Covid) Nasopharyngeal Swab     Status: None   Collection Time: 09/05/20  4:25 PM   Specimen: Nasopharyngeal Swab; Nasopharyngeal(NP) swabs in vial transport medium  Result Value Ref Range Status   SARS Coronavirus 2 by RT PCR NEGATIVE NEGATIVE Final    Comment: (NOTE) SARS-CoV-2 target nucleic acids are NOT DETECTED.  The SARS-CoV-2 RNA is  generally detectable in upper respiratory specimens during the acute phase of infection. The lowest concentration of SARS-CoV-2 viral copies this assay can detect is 138 copies/mL. A negative result does not preclude SARS-Cov-2 infection and should not be used as the sole basis for treatment or other patient management decisions. A negative result may occur with  improper specimen collection/handling, submission of specimen other than nasopharyngeal swab, presence of viral mutation(s) within the areas targeted by this assay, and inadequate number of viral copies(<138 copies/mL). A negative result must be combined with clinical observations, patient history, and epidemiological information. The expected result is Negative.  Fact Sheet for Patients:  BloggerCourse.com  Fact Sheet for Healthcare  Providers:  SeriousBroker.it  This test is no t yet approved or cleared by the [place] and  has been authorized for detection and/or diagnosis of SARS-CoV-2 by FDA under an Emergency Use Authorization (EUA). This EUA will remain  in effect (meaning this test can be used) for the duration of the COVID-19 declaration under Section 564(b)(1) of the Act, 21 U.S.C.section 360bbb-3(b)(1), unless the authorization is terminated  or revoked sooner.       Influenza A by PCR NEGATIVE NEGATIVE Final   Influenza B by PCR NEGATIVE NEGATIVE Final    Comment: (NOTE) The Xpert Xpress SARS-CoV-2/FLU/RSV plus assay is intended as an aid in the diagnosis of influenza from Nasopharyngeal swab specimens and should not be used as a sole basis for treatment. Nasal washings and aspirates are unacceptable for Xpert Xpress SARS-CoV-2/FLU/RSV testing.  Fact Sheet for Patients: BloggerCourse.com  Fact Sheet for Healthcare Providers: SeriousBroker.it  This test is not yet approved or cleared by the [place] FDA and has been authorized for detection and/or diagnosis of SARS-CoV-2 by FDA under an Emergency Use Authorization (EUA). This EUA will remain in effect (meaning this test can be used) for the duration of the COVID-19 declaration under Section 564(b)(1) of the Act, 21 U.S.C. section 360bbb-3(b)(1), unless the authorization is terminated or revoked.  Performed at [facility], [address], [place], [place] [address]   Surgical pcr screen     Status: None   Collection Time: 09/07/20  8:11 AM   Specimen: Nasal Mucosa; Nasal Swab  Result Value Ref Range Status   MRSA, PCR NEGATIVE NEGATIVE Final   Staphylococcus aureus NEGATIVE NEGATIVE Final    Comment: (NOTE) The Xpert SA Assay (FDA approved for NASAL specimens in patients 61 years of age and older), is one component of a comprehensive surveillance program. It is not intended to diagnose infection nor to guide or monitor treatment. Performed at [facility], [address] [place], [place] [address]   Aerobic/Anaerobic Culture (surgical/deep wound)     Status: None (Preliminary result)   Collection Time: 09/07/20  2:25 PM   Specimen: Pleural, Left; Lung  Result Value Ref Range Status   Specimen Description TISSUE LEFT PLEURAL  Final   Special Requests NONE  Final   Gram Stain   Final    FEW WBC PRESENT,BOTH PMN AND MONONUCLEAR NO ORGANISMS SEEN Performed at [facility], [address], [place], [place] [address]    Culture PENDING  Incomplete   Report Status PENDING  Incomplete      Studies: DG CHEST PORT 1 VIEW  Result Date: 09/08/2020 CLINICAL DATA:  Previous empyema with chest tubes in place. EXAM: PORTABLE CHEST 1 VIEW COMPARISON:  September 07, 2020 FINDINGS: Chest tube positions on the left are unchanged without evident pneumothorax. Note that the superior chest tube on the left appears kinked, unchanged. There is persistent loculated fluid in the left mid and lower lung regions,  similar to 1 day prior. There is atelectatic change in the medial left upper lobe. Right lung is clear. Heart is mildly enlarged with pulmonary vascularity normal. No adenopathy. No bone lesions. IMPRESSION: Stable chest tube positions on the left. Stable areas of loculated effusion without evident cavitation. Atelectasis left upper lobe medially. No pneumothorax. Right lung clear. Stable cardiac prominence. Electronically Signed   By: Bretta Bang III M.D.   On: 09/08/2020 08:04   DG Chest Port 1 View  Result Date: 09/07/2020 CLINICAL DATA:  Status post left thoracotomy for empyema drainage. EXAM: PORTABLE CHEST 1 VIEW COMPARISON:  CT chest and chest x-ray dated September 05, 2020. FINDINGS: The patient is rotated to the right, accentuating the cardiac silhouette and bilateral hila. Interval decrease in size of the loculated left pleural effusion status post thoracotomy and drainage. Small amount of loculated fluid persists. There are two new left-sided chest tubes. The more superior chest tube is kinked at its apex. No pneumothorax. Mild improvement in aeration at the left lung base with continued atelectasis. No acute osseous abnormality. IMPRESSION: 1. Interval decrease in size of the loculated left pleural effusion status post thoracotomy and drainage with two new left-sided chest tubes. No pneumothorax. 2. Mild improvement in aeration at the left lung base. Electronically Signed   By: Obie Dredge M.D.   On: 09/07/2020 16:12     Scheduled Meds: . acetaminophen  1,000 mg Oral Q6H   Or  . acetaminophen (TYLENOL) oral liquid 160 mg/5 mL  1,000 mg Oral Q6H  . azithromycin  500 mg Oral Daily  . bisacodyl  10 mg Oral Daily  . Chlorhexidine Gluconate Cloth  6 each Topical Daily  . enoxaparin (LOVENOX) injection  40 mg Subcutaneous Daily  . insulin aspart  0-24 Units Subcutaneous Q4H  . morphine   Intravenous Q4H  . senna-docusate  1 tablet Oral QHS   Continuous Infusions: . sodium chloride     . ceFEPime (MAXIPIME) IV 2 g (09/08/20 0923)  . dextrose 5 % and 0.9% NaCl 75 mL/hr at 09/08/20 0400  . metronidazole 500 mg (09/08/20 1239)    Principal Problem:   Empyema (HCC) Active Problems:   Sepsis (HCC)   PVC's (premature ventricular contractions)   Hypokalemia   Hypoalbuminemia     Donna Phelps Orma Flaming, [facility]  If 7PM-7AM, please contact night-coverage [email or web address] Password [facility] 09/08/2020, 12:47 PM    LOS: 2 days

## 2020-09-08 NOTE — Discharge Instructions (Signed)
Thoracotomy, Care After This sheet gives you information about how to care for yourself after your procedure. Your doctor may also give you more specific instructions. If you have problems or questions, contact your doctor. What can I expect after the procedure? After the procedure, it is common to have:  Redness, pain, and swelling around the cut from surgery (incision).  Fluid or blood coming from the cut from surgery.  Pain when you breathe in.  Trouble pooping (constipation).  Being tired.  Lack of interest in eating and drinking.  Trouble sleeping. Follow these instructions at home: Preventing lung infection  Take deep breaths or do breathing exercises to prevent lung infection (pneumonia) as told by your doctor.  Cough often. Coughing helps to clear mucus and open your lungs. If coughing hurts, hold a pillow against your chest or place both hands flat on top of your cut when you cough. This may help with discomfort.  Use an incentive spirometer as told by your doctor. This is a tool that measures how well you fill your lungs with each breath.  Do lung therapy (pulmonary rehabilitation) as told by your doctor.   Medicines  Take over-the-counter and prescription medicines only as told by your doctor.  If you have pain, take medicine to help the pain before it gets very bad. This will help you breathe and cough more easily.  If you were prescribed an antibiotic medicine, take it as told by your doctor. Do not stop taking the antibiotic even if you start to feel better.  Ask your doctor if the medicine prescribed to you: ? Requires you to avoid driving or using machinery. ? Can cause trouble pooping. You may need to take these actions to prevent or treat trouble pooping:  Drink enough fluid to keep your pee (urine) pale yellow.  Take over-the-counter or prescription medicines.  Eat foods that are high in fiber. These include beans, whole grains, and fresh fruits and  vegetables.  Limit foods that are high in fat and sugar. These include fried or sweet foods.   Activity  Rest as told by your doctor. Ask your doctor what activities are safe for you.  Do not travel by airplane for 2 weeks after your chest tube is removed, or until your doctor says that this is safe.  Do not lift anything that is heavier than 10 lb (4.5 kg), or the limit that you are told, until your doctor says that it is safe.   Incision care  Follow instructions from your doctor about how to take care of your cut from surgery. Make sure you: ? Wash your hands with soap and water for at least 20 seconds before and after you change your bandage (dressing). If you cannot use soap and water, use hand sanitizer. ? Change your bandage as told by your doctor. ? Leave stitches (sutures), skin glue, or skin tape (adhesive) strips in place. They may need to stay in place for 2 weeks or longer. If tape strips get loose and curl up, you may trim the loose edges. Do not remove tape strips completely unless your doctor says it is okay.  Keep your bandage dry.  Check the area around your cut from surgery every day for signs of infection. Check for: ? More redness, swelling, or pain. ? More fluid or blood. ? Warmth. ? Pus or a bad smell.  After your bandage has been taken off, use soap and water to gently wash your cut from surgery. Do  not use anything else to clean your cut unless your doctor tells you to.   Lifestyle  Do not take baths, swim, or use a hot tub until your doctor approves. Ask your doctor if you may take showers. You may only be allowed to take sponge baths.  Do not use any products that contain nicotine or tobacco, such as cigarettes, e-cigarettes, and chewing tobacco. If you need help quitting, ask your doctor.  Avoid secondhand smoke.  Eat a healthy diet as told by your doctor. A healthy diet includes: ? Fresh fruits and vegetables. ? Whole grains. ? Low-fat (lean)  proteins. General instructions  Wear compression stockings as told by your doctor.  If you have a chest tube, care for it as told by your doctor.  Keep all follow-up visits as told by your doctor. This is important. Contact a doctor if:  You have any signs of infection around your cut from surgery, such as: ? More redness, swelling, or pain. ? More fluid or blood. ? Warmth. ? Pus or a bad smell.  You have a fever or chills.  Your heartbeat seems uneven.  You vomit or feel like vomiting.  You have pain in your muscles or you feel weak.  You feel like you are about to faint.  You have very bad pain or pain that gets worse even when you take medicine. Get help right away if:  You get a rash.  You have trouble breathing.  You develop signs of a blood clot, such as: ? You have trouble talking. ? You are confused. ? You cannot see well. ? You are not able to move. ? You lose feeling in your face, arms, or legs (feel numb).  You faint.  You have a very bad headache all of a sudden.  You have chest pain. These symptoms may be an emergency. Do not wait to see if the symptoms will go away. Get medical help right away. Call your local emergency services (911 in the [place] Do not drive yourself to the hospital. Summary  After the procedure, it is common to have pain, redness, fluid, or blood around the area that was cut.  Take deep breaths, do breathing exercises, and cough often. This helps prevent lung infection.  Do not travel by airplane for 2 weeks after your chest tube is removed, or until your doctor says that this is safe.  Check the area around your cut from surgery every day for signs of infection.  Eat a healthy diet. This includes fresh fruits and vegetables, whole grains, and low-fat (lean) proteins. This information is not intended to replace advice given to you by your health care provider. Make sure you discuss any questions you have with your health care  provider. Document Revised: 04/26/2019 Document Reviewed: 04/26/2019 Elsevier Patient Education  2021 [employer].

## 2020-09-08 NOTE — Progress Notes (Signed)
Unable to locate Morphine PCA in pyxis for wasting.  69mL IV Morphine properly wasted in Stericycle.  Witnessed by Audie Box, RN.

## 2020-09-08 NOTE — Progress Notes (Addendum)
      [address]       [place] [address]             [phone]       1 Day Post-Op Procedure(s) (LRB): VIDEO ASSISTED THORACOSCOPY (VATS)/THOROCOTOMY (Left) EMPYEMA DRAINAGE (Left) DECORTICATION (Left)  Subjective: Patient sitting in chair. She states room is cold. Her pain is "not too bad".  Objective: Vital signs in last 24 hours: Temp:  [97.1 F (36.2 C)-98.3 F (36.8 C)] 98.3 F (36.8 C) (02/12 1037) Pulse Rate:  [66-93] 66 (02/12 1037) Cardiac Rhythm: Normal sinus rhythm (02/12 0351) Resp:  [13-74] 74 (02/12 1037) BP: (108-162)/(71-93) 124/87 (02/12 1037) SpO2:  [88 %-100 %] 97 % (02/12 1037) Arterial Line BP: (128-176)/(71-87) 156/85 (02/11 1920) FiO2 (%):  [28 %-40 %] 28 % (02/11 1929) Weight:  [75.8 kg] 75.8 kg (02/11 1132)      Intake/Output from previous day: 02/11 0701 - 02/12 0700 In: 1634.9 [I.V.:1234.9; IV Piggyback:400] Out: 690 [Urine:350; Blood:30; Chest Tube:310]   Physical Exam:  Cardiovascular: RRR Pulmonary: Clear to auscultation on the right and diminished left basilar breath sounds Wounds: Dressing is clean and dry.  Chest Tubes:to suction, no air leak  Lab Results: AGT:XMIWOE Labs    09/07/20 0029 09/08/20 0355  WBC 15.4* 15.9*  HGB 10.6* 10.3*  HCT 32.3* 32.4*  PLT 234 256   BMET:  Recent Labs    09/07/20 0029 09/08/20 0355  NA 136 136  K 3.6 3.9  CL 100 99  CO2 26 26  GLUCOSE 108* 145*  BUN 10 14  CREATININE 0.70 0.63  CALCIUM 8.4* 8.7*    PT/INR: No results for input(s): LABPROT, INR in the last 72 hours. ABG:  INR: Will add last result for INR, ABG once components are confirmed Will add last 4 CBG results once components are confirmed  Assessment/Plan:  1. CV - SR 2.  Pulmonary - On 2-3 liters of oxygen via [place]. Wean as able. Chest tubes with 310 cc of output since surgery. Chest tubes are to suction. There is no air leak. CXR this am is stable. Encourage incentive spirometer. 3. ID-on  Azithromycin, Cefepime, and Flagyl. Await OR culture 4. Anemia-H and H this am stable at 10.3 and 32.4  Donielle M ZimmermanPA-C 09/08/2020,11:22 AM [phone]   I have seen and examined the patient and agree with the assessment and plan as outlined.  Purcell Nails, MD 09/08/2020 12:36 PM

## 2020-09-09 ENCOUNTER — Inpatient Hospital Stay (HOSPITAL_COMMUNITY): Payer: Medicaid Other

## 2020-09-09 DIAGNOSIS — J869 Pyothorax without fistula: Secondary | ICD-10-CM | POA: Diagnosis not present

## 2020-09-09 LAB — CBC
HCT: 30.1 % — ABNORMAL LOW (ref 36.0–46.0)
Hemoglobin: 10 g/dL — ABNORMAL LOW (ref 12.0–15.0)
MCH: 28.6 pg (ref 26.0–34.0)
MCHC: 33.2 g/dL (ref 30.0–36.0)
MCV: 86 fL (ref 80.0–100.0)
Platelets: 264 10*3/uL (ref 150–400)
RBC: 3.5 MIL/uL — ABNORMAL LOW (ref 3.87–5.11)
RDW: 15.9 % — ABNORMAL HIGH (ref 11.5–15.5)
WBC: 11.2 10*3/uL — ABNORMAL HIGH (ref 4.0–10.5)
nRBC: 0 % (ref 0.0–0.2)

## 2020-09-09 LAB — GLUCOSE, CAPILLARY
Glucose-Capillary: 115 mg/dL — ABNORMAL HIGH (ref 70–99)
Glucose-Capillary: 118 mg/dL — ABNORMAL HIGH (ref 70–99)
Glucose-Capillary: 121 mg/dL — ABNORMAL HIGH (ref 70–99)
Glucose-Capillary: 121 mg/dL — ABNORMAL HIGH (ref 70–99)
Glucose-Capillary: 152 mg/dL — ABNORMAL HIGH (ref 70–99)
Glucose-Capillary: 98 mg/dL (ref 70–99)

## 2020-09-09 LAB — COMPREHENSIVE METABOLIC PANEL
ALT: 21 U/L (ref 0–44)
AST: 24 U/L (ref 15–41)
Albumin: 2 g/dL — ABNORMAL LOW (ref 3.5–5.0)
Alkaline Phosphatase: 53 U/L (ref 38–126)
Anion gap: 8 (ref 5–15)
BUN: 9 mg/dL (ref 8–23)
CO2: 25 mmol/L (ref 22–32)
Calcium: 8.2 mg/dL — ABNORMAL LOW (ref 8.9–10.3)
Chloride: 103 mmol/L (ref 98–111)
Creatinine, Ser: 0.64 mg/dL (ref 0.44–1.00)
GFR, Estimated: >60 mL/min (ref >60)
Glucose, Bld: 150 mg/dL — ABNORMAL HIGH (ref 70–99)
Potassium: 3.6 mmol/L (ref 3.5–5.1)
Sodium: 136 mmol/L (ref 135–145)
Total Bilirubin: 0.2 mg/dL — ABNORMAL LOW (ref 0.3–1.2)
Total Protein: 5.4 g/dL — ABNORMAL LOW (ref 6.5–8.1)

## 2020-09-09 MED ORDER — POTASSIUM CHLORIDE CRYS ER 20 MEQ PO TBCR
20.0000 meq | EXTENDED_RELEASE_TABLET | ORAL | Status: AC
  Administered 2020-09-09 (×3): 20 meq via ORAL
  Filled 2020-09-09 (×3): qty 1

## 2020-09-09 NOTE — Progress Notes (Addendum)
      [address]       [place] [address]             [phone]       2 Days Post-Op Procedure(s) (LRB): VIDEO ASSISTED THORACOSCOPY (VATS)/THOROCOTOMY (Left) EMPYEMA DRAINAGE (Left) DECORTICATION (Left)  Subjective: Patient sitting in chair. She states she feels better than when she first came in to hospital.  Objective: Vital signs in last 24 hours: Temp:  [97.6 F (36.4 C)-98.7 F (37.1 C)] 97.7 F (36.5 C) (02/13 0922) Pulse Rate:  [63-85] 63 (02/13 0922) Cardiac Rhythm: Normal sinus rhythm (02/13 0715) Resp:  [13-22] 18 (02/13 0922) BP: (115-137)/(83-95) 137/90 (02/13 0922) SpO2:  [90 %-98 %] 95 % (02/13 0922) FiO2 (%):  [28 %] 28 % (02/12 2203)      Intake/Output from previous day: 02/12 0701 - 02/13 0700 In: 2988 [P.O.:960; I.V.:1425.5; IV Piggyback:602.4] Out: 370 [Urine:200; Chest Tube:170]   Physical Exam:  Cardiovascular: RRR Pulmonary: Clear to auscultation on the right and diminished left basilar breath sounds Wounds: Aquacel intact;will remove tomorrow Chest Tubes:to suction, no air leak  Lab Results: CBC: Recent Labs    09/08/20 0355 09/09/20 0116  WBC 15.9* 11.2*  HGB 10.3* 10.0*  HCT 32.4* 30.1*  PLT 256 264   BMET:  Recent Labs    09/08/20 0355 09/09/20 0116  NA 136 136  K 3.9 3.6  CL 99 103  CO2 26 25  GLUCOSE 145* 150*  BUN 14 9  CREATININE 0.63 0.64  CALCIUM 8.7* 8.2*    PT/INR: No results for input(s): LABPROT, INR in the last 72 hours. ABG:  INR: Will add last result for INR, ABG once components are confirmed Will add last 4 CBG results once components are confirmed  Assessment/Plan:  1. CV - SR 2.  Pulmonary - On 2-3 liters of oxygen via [place]. Wean as able. Chest tubes with 170 cc of output since surgery. Chest tubes are to suction. There is no air leak. CXR this am is stable. Hope to remove 1 chest tube soon.  Encourage incentive spirometer. 3. ID-on Cefepime, and Flagyl. OR culture shows no  growth to date 4. Anemia-H and H this am stable at 10.3 and 32.4  Donielle M ZimmermanPA-C 09/09/2020,10:17 AM [phone]   I have seen and examined the patient and agree with the assessment and plan as outlined.  Leave tubes in today and possibly start removing them tomorrow if output decreases further  Purcell Nails, MD 09/09/2020 11:48 AM

## 2020-09-09 NOTE — Progress Notes (Signed)
PROGRESS NOTE    Donna Phelps  EGB:[id number]  DOB: 1960/10/04  DOA: 09/06/2020 PCP: Abram Sander, MD Outpatient Specialists:   Hospital course:  60 year old female with history of HTN, asthma, hep C s/p Harvoni, anxiety, depression, tobacco use disorder, GERD and recent hospitalization from 1/28-1/29 for right THA presented to [facility] with the above complaints and found to have extensive loculated pleural effusion consistent with empyema likely from pneumonia.  She was septic on admission.  She was admitted to [facility] with sepsis due to pneumonia/left empyema for IV antibiotics and VATS procedure.   Initially started on IV ceftriaxone, azithromycin and IV Flagyl changed to cefepime, azithromycin and Flagyl given recent hospitalization. MRSA PCR negative so vancomycin was discontinued.  Subjective:  Patient states she continues to feel improved.  She is much improved from admission and feels about the same as yesterday.  She has been walking in the hallway.  Notes she is not in too much pain that it is well controlled.  Malaise is improved.  Shortness of breath is improved.   Objective: Vitals:   09/09/20 0300 09/09/20 0756 09/09/20 0922 09/09/20 1130  BP: (!) 115/91  137/90 (!) 132/95  Pulse: 69  63 61  Resp: 19 19 18 20   Temp: 97.6 F (36.4 C)  97.7 F (36.5 C)   TempSrc: Oral  Oral Oral  SpO2: 95% 94% 95% 96%  Weight:      Height:        Intake/Output Summary (Last 24 hours) at 09/09/2020 1358 Last data filed at 09/09/2020 0730 Gross per 24 hour  Intake 2743.96 ml  Output 120 ml  Net 2623.96 ml   Filed Weights   09/06/20 1100 09/07/20 1132  Weight: 75.8 kg 75.8 kg     Exam:  General: Patient sitting up in recliner with chest tubes to waterseal.  She is able to speak in full sentences without difficulty.  She is appears little bit more tired than yesterday. Eyes: sclera anicteric, conjuctiva mild injection bilaterally CVS: S1-S2, regular  Respiratory:  Reasonable air entry left upper lung field, decreased air entry lower lung fields bilaterally.   GI: NABS, soft, NT  LE: No edema.  Neuro: A/O x 3, Moving all extremities equally with normal strength, CN 3-12 intact, grossly nonfocal.  Psych: patient is logical and coherent, judgement and insight appear normal, mood and affect appropriate to situation.   Assessment & Plan:   60 year old female with empyema status post VATS procedure with decortication yesterday.  Left empyema Clinically improved after VATS procedure on 09/07/2020 Leukocytosis improving, now down to 11.2 Sepsis has resolved Continue cefepime, Flagyl and azithromycin day #3 MRSA PCR is negative Blood cultures no growth to date  HTN Can restart HCTZ/lisinopril once her blood pressure has rebounded Blood pressure presently within normal limits off antihypertensives.  Status post right THA 08/24/2020 Continue PT and OT once patient is able to participate in therapy    DVT prophylaxis: Lovenox Code Status: Full Family Communication: None Disposition Plan:   Patient is from: Home  Anticipated Discharge Location: Home  Barriers to Discharge: On IV antibiotics and still on chest tube to waterseal  Is patient medically stable for Discharge: No   Consultants:  CT surgery  Procedures:  Status post VATS procedure 09/07/2020  Antimicrobials:  Cefepime  Flagyl  Azithromycin   Data Reviewed:  Basic Metabolic Panel: Recent Labs  Lab 09/05/20 1312 09/06/20 1058 09/07/20 0029 09/08/20 0355 09/09/20 0116  NA 135 137 136 136 136  K 3.4* 3.2* 3.6 3.9 3.6  CL 98 101 100 99 103  CO2 24 26 26 26 25   GLUCOSE 114* 113* 108* 145* 150*  BUN 16 11 10 14 9   CREATININE 0.67 0.74 0.70 0.63 0.64  CALCIUM 8.9 8.4* 8.4* 8.7* 8.2*  MG  --  1.7  --  2.1  --   PHOS  --   --   --  3.1  --    Liver Function Tests: Recent Labs  Lab 09/06/20 1058 09/08/20 0355 09/09/20 0116  AST 22  --  24  ALT 19  --  21   ALKPHOS 61  --  53  BILITOT 0.5  --  0.2*  PROT 6.9  --  5.4*  ALBUMIN 2.6* 2.2* 2.0*   No results for input(s): LIPASE, AMYLASE in the last 168 hours. No results for input(s): AMMONIA in the last 168 hours. CBC: Recent Labs  Lab 09/05/20 1312 09/06/20 1058 09/07/20 0029 09/08/20 0355 09/09/20 0116  WBC 17.9* 16.4* 15.4* 15.9* 11.2*  NEUTROABS  --  13.1*  --   --   --   HGB 12.6 11.7* 10.6* 10.3* 10.0*  HCT 39.2 36.8 32.3* 32.4* 30.1*  MCV 85.4 85.4 85.4 86.6 86.0  PLT 242 225 234 256 264   Cardiac Enzymes: No results for input(s): CKTOTAL, CKMB, CKMBINDEX, TROPONINI in the last 168 hours. BNP (last 3 results) No results for input(s): PROBNP in the last 8760 hours. CBG: Recent Labs  Lab 09/08/20 1918 09/08/20 2302 09/09/20 0315 09/09/20 0753 09/09/20 1133  GLUCAP 123* 116* 152* 98 121*    Recent Results (from the past 240 hour(s))  Blood culture (routine x 2)     Status: None (Preliminary result)   Collection Time: 09/05/20  2:30 PM   Specimen: BLOOD  Result Value Ref Range Status   Specimen Description BLOOD BLOOD LEFT FOREARM  Final   Special Requests   Final    BOTTLES DRAWN AEROBIC AND ANAEROBIC Blood Culture adequate volume   Culture   Final    NO GROWTH 4 DAYS Performed at [facility], [address], [place], [address] [place]    Report Status PENDING  Incomplete  Blood culture (routine x 2)     Status: None (Preliminary result)   Collection Time: 09/05/20  2:45 PM   Specimen: BLOOD  Result Value Ref Range Status   Specimen Description BLOOD LEFT ANTECUBITAL  Final   Special Requests   Final    BOTTLES DRAWN AEROBIC AND ANAEROBIC Blood Culture results may not be optimal due to an excessive volume of blood received in culture bottles   Culture   Final    NO GROWTH 4 DAYS Performed at [facility], [address], [place], [address] [place]    Report Status PENDING  Incomplete  Resp Panel by RT-PCR (Flu A&B, Covid)  Nasopharyngeal Swab     Status: None   Collection Time: 09/05/20  4:25 PM   Specimen: Nasopharyngeal Swab; Nasopharyngeal(NP) swabs in vial transport medium  Result Value Ref Range Status   SARS Coronavirus 2 by RT PCR NEGATIVE NEGATIVE Final    Comment: (NOTE) SARS-CoV-2 target nucleic acids are NOT DETECTED.  The SARS-CoV-2 RNA is generally detectable in upper respiratory specimens during the acute phase of infection. The lowest concentration of SARS-CoV-2 viral copies this assay can detect is 138 copies/mL. A negative result does not preclude SARS-Cov-2 infection and should not be used as the sole basis for treatment or  other patient management decisions. A negative result may occur with  improper specimen collection/handling, submission of specimen other than nasopharyngeal swab, presence of viral mutation(s) within the areas targeted by this assay, and inadequate number of viral copies(<138 copies/mL). A negative result must be combined with clinical observations, patient history, and epidemiological information. The expected result is Negative.  Fact Sheet for Patients:  BloggerCourse.comhttps://www.fda.gov/media/152166/download  Fact Sheet for Healthcare Providers:  SeriousBroker.ithttps://www.fda.gov/media/152162/download  This test is no t yet approved or cleared by the [place] FDA and  has been authorized for detection and/or diagnosis of SARS-CoV-2 by FDA under an Emergency Use Authorization (EUA). This EUA will remain  in effect (meaning this test can be used) for the duration of the COVID-19 declaration under Section 564(b)(1) of the Act, 21 U.S.C.section 360bbb-3(b)(1), unless the authorization is terminated  or revoked sooner.       Influenza A by PCR NEGATIVE NEGATIVE Final   Influenza B by PCR NEGATIVE NEGATIVE Final    Comment: (NOTE) The Xpert Xpress SARS-CoV-2/FLU/RSV plus assay is intended as an aid in the diagnosis of influenza from Nasopharyngeal swab specimens and should not be  used as a sole basis for treatment. Nasal washings and aspirates are unacceptable for Xpert Xpress SARS-CoV-2/FLU/RSV testing.  Fact Sheet for Patients: BloggerCourse.comhttps://www.fda.gov/media/152166/download  Fact Sheet for Healthcare Providers: SeriousBroker.ithttps://www.fda.gov/media/152162/download  This test is not yet approved or cleared by the [place] FDA and has been authorized for detection and/or diagnosis of SARS-CoV-2 by FDA under an Emergency Use Authorization (EUA). This EUA will remain in effect (meaning this test can be used) for the duration of the COVID-19 declaration under Section 564(b)(1) of the Act, 21 U.S.C. section 360bbb-3(b)(1), unless the authorization is terminated or revoked.  Performed at [facility], [address] [place], [place] [address]   Surgical pcr screen     Status: None   Collection Time: 09/07/20  8:11 AM   Specimen: Nasal Mucosa; Nasal Swab  Result Value Ref Range Status   MRSA, PCR NEGATIVE NEGATIVE Final   Staphylococcus aureus NEGATIVE NEGATIVE Final    Comment: (NOTE) The Xpert SA Assay (FDA approved for NASAL specimens in patients 62 years of age and older), is one component of a comprehensive surveillance program. It is not intended to diagnose infection nor to guide or monitor treatment. Performed at [facility], [address] [place], [place] [address]   Aerobic/Anaerobic Culture (surgical/deep wound)     Status: None (Preliminary result)   Collection Time: 09/07/20  2:25 PM   Specimen: Pleural, Left; Lung  Result Value Ref Range Status   Specimen Description TISSUE LEFT PLEURAL  Final   Special Requests NONE  Final   Gram Stain   Final    FEW WBC PRESENT,BOTH PMN AND MONONUCLEAR NO ORGANISMS SEEN    Culture   Final    NO GROWTH 2 DAYS Performed at [facility], [address] [place], [place] [address]    Report Status PENDING  Incomplete      Studies: DG CHEST PORT 1 VIEW  Result Date:  09/09/2020 CLINICAL DATA:  Recent empyema with chest tubes in place EXAM: PORTABLE CHEST 1 VIEW COMPARISON:  September 08, 2020 FINDINGS: Chest tube positions on the left are stable. No pneumothorax appreciable. Apparent kink in more superiorly placed chest tube on the left is stable. Loculated fluid on the left is unchanged. Areas of atelectatic change again noted in the left upper lobe. Right lung is clear. Heart is  mildly enlarged with normal pulmonary vascularity. No adenopathy. No bone lesions. IMPRESSION: Stable chest tube positions on the left. Loculated pleural fluid without pneumothorax. Atelectasis left upper lobe. No new opacity. Right lung clear. Stable cardiac silhouette. Electronically Signed   By: Bretta Bang III M.D.   On: 09/09/2020 08:09   DG CHEST PORT 1 VIEW  Result Date: 09/08/2020 CLINICAL DATA:  Previous empyema with chest tubes in place. EXAM: PORTABLE CHEST 1 VIEW COMPARISON:  September 07, 2020 FINDINGS: Chest tube positions on the left are unchanged without evident pneumothorax. Note that the superior chest tube on the left appears kinked, unchanged. There is persistent loculated fluid in the left mid and lower lung regions, similar to 1 day prior. There is atelectatic change in the medial left upper lobe. Right lung is clear. Heart is mildly enlarged with pulmonary vascularity normal. No adenopathy. No bone lesions. IMPRESSION: Stable chest tube positions on the left. Stable areas of loculated effusion without evident cavitation. Atelectasis left upper lobe medially. No pneumothorax. Right lung clear. Stable cardiac prominence. Electronically Signed   By: Bretta Bang III M.D.   On: 09/08/2020 08:04   DG Chest Port 1 View  Result Date: 09/07/2020 CLINICAL DATA:  Status post left thoracotomy for empyema drainage. EXAM: PORTABLE CHEST 1 VIEW COMPARISON:  CT chest and chest x-ray dated September 05, 2020. FINDINGS: The patient is rotated to the right, accentuating the cardiac  silhouette and bilateral hila. Interval decrease in size of the loculated left pleural effusion status post thoracotomy and drainage. Small amount of loculated fluid persists. There are two new left-sided chest tubes. The more superior chest tube is kinked at its apex. No pneumothorax. Mild improvement in aeration at the left lung base with continued atelectasis. No acute osseous abnormality. IMPRESSION: 1. Interval decrease in size of the loculated left pleural effusion status post thoracotomy and drainage with two new left-sided chest tubes. No pneumothorax. 2. Mild improvement in aeration at the left lung base. Electronically Signed   By: Obie Dredge M.D.   On: 09/07/2020 16:12     Scheduled Meds: . acetaminophen  1,000 mg Oral Q6H   Or  . acetaminophen (TYLENOL) oral liquid 160 mg/5 mL  1,000 mg Oral Q6H  . bisacodyl  10 mg Oral Daily  . Chlorhexidine Gluconate Cloth  6 each Topical Daily  . enoxaparin (LOVENOX) injection  40 mg Subcutaneous Daily  . insulin aspart  0-24 Units Subcutaneous Q4H  . morphine   Intravenous Q4H  . senna-docusate  1 tablet Oral QHS   Continuous Infusions: . sodium chloride    . ceFEPime (MAXIPIME) IV 2 g (09/09/20 0919)  . dextrose 5 % and 0.9% NaCl 75 mL/hr at 09/09/20 0456  . metronidazole 500 mg (09/09/20 1256)    Principal Problem:   Empyema (HCC) Active Problems:   Sepsis (HCC)   PVC's (premature ventricular contractions)   Hypokalemia   Hypoalbuminemia     Johnell Bas Orma Flaming, [facility]  If 7PM-7AM, please contact night-coverage [email or web address] Password [email or web address] 09/09/2020, 1:58 PM    LOS: 3 days

## 2020-09-10 ENCOUNTER — Encounter (HOSPITAL_COMMUNITY): Payer: Self-pay | Admitting: Surgery

## 2020-09-10 ENCOUNTER — Inpatient Hospital Stay (HOSPITAL_COMMUNITY): Payer: Medicaid Other

## 2020-09-10 DIAGNOSIS — J869 Pyothorax without fistula: Secondary | ICD-10-CM | POA: Diagnosis not present

## 2020-09-10 LAB — CULTURE, BLOOD (ROUTINE X 2)
Culture: NO GROWTH
Culture: NO GROWTH
Special Requests: ADEQUATE

## 2020-09-10 LAB — BASIC METABOLIC PANEL
Anion gap: 7 (ref 5–15)
BUN: 9 mg/dL (ref 8–23)
CO2: 25 mmol/L (ref 22–32)
Calcium: 8.2 mg/dL — ABNORMAL LOW (ref 8.9–10.3)
Chloride: 106 mmol/L (ref 98–111)
Creatinine, Ser: 0.61 mg/dL (ref 0.44–1.00)
GFR, Estimated: >60 mL/min (ref >60)
Glucose, Bld: 114 mg/dL — ABNORMAL HIGH (ref 70–99)
Potassium: 4 mmol/L (ref 3.5–5.1)
Sodium: 138 mmol/L (ref 135–145)

## 2020-09-10 LAB — GLUCOSE, CAPILLARY
Glucose-Capillary: 109 mg/dL — ABNORMAL HIGH (ref 70–99)
Glucose-Capillary: 110 mg/dL — ABNORMAL HIGH (ref 70–99)
Glucose-Capillary: 114 mg/dL — ABNORMAL HIGH (ref 70–99)
Glucose-Capillary: 119 mg/dL — ABNORMAL HIGH (ref 70–99)
Glucose-Capillary: 97 mg/dL (ref 70–99)
Glucose-Capillary: 99 mg/dL (ref 70–99)

## 2020-09-10 LAB — CBC
HCT: 30.5 % — ABNORMAL LOW (ref 36.0–46.0)
Hemoglobin: 9.9 g/dL — ABNORMAL LOW (ref 12.0–15.0)
MCH: 27.7 pg (ref 26.0–34.0)
MCHC: 32.5 g/dL (ref 30.0–36.0)
MCV: 85.2 fL (ref 80.0–100.0)
Platelets: 294 10*3/uL (ref 150–400)
RBC: 3.58 MIL/uL — ABNORMAL LOW (ref 3.87–5.11)
RDW: 15.7 % — ABNORMAL HIGH (ref 11.5–15.5)
WBC: 9.9 10*3/uL (ref 4.0–10.5)
nRBC: 0 % (ref 0.0–0.2)

## 2020-09-10 MED ORDER — KETOROLAC TROMETHAMINE 15 MG/ML IJ SOLN
15.0000 mg | Freq: Four times a day (QID) | INTRAMUSCULAR | Status: DC | PRN
  Administered 2020-09-10 – 2020-09-11 (×5): 15 mg via INTRAVENOUS
  Filled 2020-09-10 (×5): qty 1

## 2020-09-10 NOTE — Plan of Care (Signed)

## 2020-09-10 NOTE — Evaluation (Signed)
Physical Therapy Evaluation Patient Details Name: Donna Phelps MRN: [id number] DOB: Dec 18, 1960 Today's Date: 09/10/2020   History of Present Illness  60 year old female  presented to [facility]  and found to have extensive loculated pleural effusion consistent with empyema likely from pneumonia and sepsis.  She was transferred to [facility] for IV antibiotics and VATS procedure. PMH:  HTN, asthma, hep C s/p Harvoni, anxiety, depression, tobacco use disorder, GERD and recent hospitalization from 1/28-1/29 for right THA.  Clinical Impression  Pt admitted with above diagnosis. Pt was able to progress ambulation safely with cane however very slow moving. Pt states this is her baseline and she is a little slower due to pain at chest tube site. Should progress well.  Pt currently with functional limitations due to the deficits listed below (see PT Problem List). Pt will benefit from skilled PT to increase their independence and safety with mobility to allow discharge to the venue listed below.      Follow Up Recommendations Home health PT;Supervision - Intermittent    Equipment Recommendations  None recommended by PT    Recommendations for Other Services       Precautions / Restrictions Precautions Precautions: Fall;Anterior Hip (recent anterior THA) Restrictions Weight Bearing Restrictions: No      Mobility  Bed Mobility Overal bed mobility: Independent             General bed mobility comments: Pt was in bathroom on arrival standing in front of toilet and was cleaning herself.  Walked in hallway from here and then did not need assist to get back into bed at end of treatment.    Transfers Overall transfer level: Needs assistance Equipment used: Straight cane Transfers: Sit to/from Stand Sit to Stand: Supervision         General transfer comment: Pt does not lose balance.  Pt does widen her BOS for balance frequently.  Ambulation/Gait Ambulation/Gait assistance: Min  guard;Supervision Gait Distance (Feet): 125 Feet Assistive device: Straight cane Gait Pattern/deviations: Step-to pattern;Decreased stance time - right;Decreased weight shift to right;Antalgic;Wide base of support   Gait velocity interpretation: <1.31 ft/sec, indicative of household ambulator General Gait Details: Pt insisted on using her cane. Slow antalgic gait but no LOB.  Pt able to ambulate in hallway.  Pt reports that pain at chest tube site limits her speed.  Stairs            Wheelchair Mobility    Modified Rankin (Stroke Patients Only)       Balance Overall balance assessment: Needs assistance Sitting-balance support: No upper extremity supported;Feet supported Sitting balance-Leahy Scale: Good     Standing balance support: Single extremity supported;During functional activity Standing balance-Leahy Scale: Fair Standing balance comment: Stood and cleaned self at toilet wihtout UE support. Washed hands at sink as well. Did need the cane with dynamic gait.                             Pertinent Vitals/Pain Pain Assessment: Faces Faces Pain Scale: Hurts even more Pain Location: chest tube site Pain Descriptors / Indicators: Aching;Grimacing;Guarding Pain Intervention(s): Limited activity within patient's tolerance;Monitored during session;Repositioned    Home Living Family/patient expects to be discharged to:: Private residence Living Arrangements: Alone Available Help at Discharge: Friend(s);Available PRN/intermittently Type of Home: House Home Access: Stairs to enter Entrance Stairs-Rails: None Entrance Stairs-Number of Steps: 1 Home Layout: One level Home Equipment: Cane - single point;Bedside commode  Prior Function Level of Independence: Independent         Comments: Using cane at times per pt, Independent with ADLs     Hand Dominance   Dominant Hand: Right    Extremity/Trunk Assessment   Upper Extremity Assessment Upper  Extremity Assessment: Defer to OT evaluation    Lower Extremity Assessment Lower Extremity Assessment: Generalized weakness    Cervical / Trunk Assessment Cervical / Trunk Assessment: Kyphotic  Communication   Communication: No difficulties  Cognition Arousal/Alertness: Awake/alert Behavior During Therapy: WFL for tasks assessed/performed Overall Cognitive Status: Within Functional Limits for tasks assessed                                        General Comments      Exercises General Exercises - Lower Extremity Ankle Circles/Pumps: AROM;Both;10 reps;Supine Heel Slides: AROM;Both;10 reps;Supine   Assessment/Plan    PT Assessment Patient needs continued PT services  PT Problem List Decreased activity tolerance;Decreased balance;Decreased mobility;Decreased knowledge of use of DME;Decreased safety awareness;Decreased knowledge of precautions;Pain       PT Treatment Interventions DME instruction;Gait training;Functional mobility training;Therapeutic activities;Stair training;Therapeutic exercise;Balance training;Patient/family education    PT Goals (Current goals can be found in the Care Plan section)  Acute Rehab PT Goals Patient Stated Goal: to go home PT Goal Formulation: With patient Time For Goal Achievement: 09/24/20 Potential to Achieve Goals: Good    Frequency Min 3X/week   Barriers to discharge        Co-evaluation               AM-PAC PT "6 Clicks" Mobility  Outcome Measure Help needed turning from your back to your side while in a flat bed without using bedrails?: None Help needed moving from lying on your back to sitting on the side of a flat bed without using bedrails?: None Help needed moving to and from a bed to a chair (including a wheelchair)?: None Help needed standing up from a chair using your arms (e.g., wheelchair or bedside chair)?: None Help needed to walk in hospital room?: A Little Help needed climbing 3-5 steps with  a railing? : A Little 6 Click Score: 22    End of Session Equipment Utilized During Treatment: Gait belt Activity Tolerance: Patient limited by fatigue Patient left: in bed;with call bell/phone within reach;with bed alarm set Nurse Communication: Mobility status PT Visit Diagnosis: Muscle weakness (generalized) (M62.81)    Time: 8185-6314 PT Time Calculation (min) (ACUTE ONLY): 27 min   Charges:   PT Evaluation $PT Eval Moderate Complexity: 1 Mod PT Treatments $Gait Training: 8-22 mins        Berish Bohman W,PT Acute Rehabilitation Services Pager:  [phone]  Office:  [phone]    Berline Lopes 09/10/2020, 1:35 PM

## 2020-09-10 NOTE — Progress Notes (Signed)
[facility] PROGRESS NOTE    Donna Phelps  WYO:[id number] DOB: [birth year] DOA: 09/06/2020 PCP: Frazier Richards, MD      Brief Narrative:  Donna Phelps is a 60 y.o. F with HTN, asthma mild intermittent, smoking, hep C s/p Harvoni and recent right THA who presented with cough, shortness of breath, chest pain, fever, and chills.  She was febrile in the ER and CT chest showed a large loculated pleural effusion with empyema.  Case was discussed with CT surgery who recommended transfer for VATS.  2/11: S/p left thoracotomy and decortication of the left lung with 2 chest tubes 2/14: Posterior chest tube removed          Assessment & Plan:  Empyema Sepsis, resolved Patient met sepsis criteria on admission with fever, leukocytosis and loculated pleural effusion.  -Continue cefepime and Flagyl, day 6 -Chest tube management per CT surgery   Hypertension BP trending up, but still mostly normal. -Continue to hold lisinopril/HCTZ  Recent right total hip replacement -Lovenox for DVT prophylaxis   Acute blood loss anemia Hemoglobin trending down, but no clinical bleeding other than scant, expected bleeding from her chest tube.  Asthma Chart histry, no evidence of exacerbation here.         Disposition: Status is: Inpatient  Remains inpatient appropriate because:Inpatient level of care appropriate due to severity of illness   Dispo: The patient is from: Home              Anticipated d/c is to: Home              Anticipated d/c date is: 3 days              Patient currently is not medically stable to d/c.   Difficult to place patient No       Level of care: Progressive       MDM: The below labs and imaging reports were reviewed and summarized above.  Medication management as above.   DVT prophylaxis: enoxaparin (LOVENOX) injection 40 mg Start: 09/08/20 1000 SCD's Start: 09/07/20 2013  Code Status: FULL Family Communication:      Consultants:   Cardiothoracic surgery  Procedures:   2/11 thoracotomy  Antimicrobials:   Ceftriaxone 2/9-2/10  Cefepime 2/11>>  Azithromycin 2/9-2/13  Flagyl 2/9>>   Culture data:   2/9 blood culture no growth to date  2/11 thoracotomy culture-no growth to date          Subjective: Patient is some left-sided chest pain.  She is tired.  She has no appetite.  No fever, confusion.  Objective: Vitals:   09/10/20 0348 09/10/20 0600 09/10/20 0736 09/10/20 1043  BP: 125/82  (!) 149/102 (!) 146/82  Pulse: 82  74 68  Resp: _0 Temp: 98.9 F (37.2 C)  98.3 F (36.8 C) 98.5 F (36.9 C)  TempSrc: Oral  Oral Oral  SpO2: 98% 95% 95% 94%  Weight:      Height:        Intake/Output Summary (Last 24 hours) at 09/10/2020 1403 Last data filed at 09/10/2020 1100 Gross per 24 hour  Intake 2368.96 ml  Output 150 ml  Net 2218.96 ml   Filed Weights   09/06/20 1100 09/07/20 1132  Weight: 75.8 kg 75.8 kg    Examination: General appearance:  adult female, alert and in no acute distress.   HEENT: Anicteric, conjunctiva pink, lids and lashes normal. No nasal deformity, discharge, epistaxis.  Lips moist, dentition  normal repair, oropharynx moist, no oral lesions, hearing normal.   Skin: Warm and dry.  No jaundice.  No suspicious rashes or lesions. Cardiac: RRR, nl S1-S2, no murmurs appreciated.  Capillary refill is brisk.  JVP normal.  No LE edema.  Radial pulses 2+ and symmetric. Respiratory: Normal respiratory rate and rhythm.  Lung sounds diminished at both bases, worse on the left, with some crackles.  No wheezing.  2 chest tubes still in place. Abdomen: No ascites, distension, hepatosplenomegaly.   MSK: No deformities or effusions. Neuro: Awake and alert.  EOMI, moves all extremities with generalized weakness. Speech fluent.    Psych: Sensorium intact and responding to questions, attention normal. Affect normal.  Judgment and insight appear  normal.    Data Reviewed: I have personally reviewed following labs and imaging studies:  CBC: Recent Labs  Lab 09/06/20 1058 09/07/20 0029 09/08/20 0355 09/09/20 0116 09/10/20 0026  WBC 16.4* 15.4* 15.9* 11.2* 9.9  NEUTROABS 13.1*  --   --   --   --   HGB 11.7* 10.6* 10.3* 10.0* 9.9*  HCT 36.8 32.3* 32.4* 30.1* 30.5*  MCV 85.4 85.4 86.6 86.0 85.2  PLT 225 234 256 264 161   Basic Metabolic Panel: Recent Labs  Lab 09/06/20 1058 09/07/20 0029 09/08/20 0355 09/09/20 0116 09/10/20 0026  NA 137 136 136 136 138  K 3.2* 3.6 3.9 3.6 4.0  CL 101 100 99 103 106  CO2 _0 GLUCOSE 113* 108* 145* 150* 114*  BUN _1 CREATININE 0.74 0.70 0.63 0.64 0.61  CALCIUM 8.4* 8.4* 8.7* 8.2* 8.2*  MG 1.7  --  2.1  --   --   PHOS  --   --  3.1  --   --    GFR: Estimated Creatinine Clearance: 76.8 mL/min (by C-G formula based on SCr of 0.61 mg/dL). Liver Function Tests: Recent Labs  Lab 09/06/20 1058 09/08/20 0355 09/09/20 0116  AST 22  --  24  ALT 19  --  21  ALKPHOS 61  --  53  BILITOT 0.5  --  0.2*  PROT 6.9  --  5.4*  ALBUMIN 2.6* 2.2* 2.0*   No results for input(s): LIPASE, AMYLASE in the last 168 hours. No results for input(s): AMMONIA in the last 168 hours. Coagulation Profile: No results for input(s): INR, PROTIME in the last 168 hours. Cardiac Enzymes: No results for input(s): CKTOTAL, CKMB, CKMBINDEX, TROPONINI in the last 168 hours. BNP (last 3 results) No results for input(s): PROBNP in the last 8760 hours. HbA1C: No results for input(s): HGBA1C in the last 72 hours. CBG: Recent Labs  Lab 09/09/20 2006 09/09/20 2345 09/10/20 0351 09/10/20 0808 09/10/20 1115  GLUCAP 115* 118* 114* 99 109*   Lipid Profile: No results for input(s): CHOL, HDL, LDLCALC, TRIG, CHOLHDL, LDLDIRECT in the last 72 hours. Thyroid Function Tests: No results for input(s): TSH, T4TOTAL, FREET4, T3FREE, THYROIDAB in the last 72 hours. Anemia Panel: No results for  input(s): VITAMINB12, FOLATE, FERRITIN, TIBC, IRON, RETICCTPCT in the last 72 hours. Urine analysis:    Component Value Date/Time   COLORURINE YELLOW (A) 08/23/2019 1309   APPEARANCEUR HAZY (A) 08/23/2019 1309   LABSPEC 1.019 08/23/2019 1309   PHURINE 6.0 08/23/2019 1309   GLUCOSEU NEGATIVE 08/23/2019 1309   HGBUR NEGATIVE 08/23/2019 1309   BILIRUBINUR NEGATIVE 08/23/2019 1309   KETONESUR NEGATIVE 08/23/2019 1309   PROTEINUR NEGATIVE 08/23/2019 1309   NITRITE NEGATIVE 08/23/2019  [place] (A) 08/23/2019 1309   Sepsis Labs: _0 (procalcitonin:4,lacticacidven:4)  ) Recent Results (from the past 240 hour(s))  Blood culture (routine x 2)     Status: None   Collection Time: 09/05/20  2:30 PM   Specimen: BLOOD  Result Value Ref Range Status   Specimen Description BLOOD BLOOD LEFT FOREARM  Final   Special Requests   Final    BOTTLES DRAWN AEROBIC AND ANAEROBIC Blood Culture adequate volume   Culture   Final    NO GROWTH 5 DAYS Performed at [facility], ., [place], [place] [address]    Report Status 09/10/2020 FINAL  Final  Blood culture (routine x 2)     Status: None   Collection Time: 09/05/20  2:45 PM   Specimen: BLOOD  Result Value Ref Range Status   Specimen Description BLOOD LEFT ANTECUBITAL  Final   Special Requests   Final    BOTTLES DRAWN AEROBIC AND ANAEROBIC Blood Culture results may not be optimal due to an excessive volume of blood received in culture bottles   Culture   Final    NO GROWTH 5 DAYS Performed at [facility], [address] [place], [place] [address]    Report Status 09/10/2020 FINAL  Final  Resp Panel by RT-PCR (Flu A&B, Covid) Nasopharyngeal Swab     Status: None   Collection Time: 09/05/20  4:25 PM   Specimen: Nasopharyngeal Swab; Nasopharyngeal(NP) swabs in vial transport medium  Result Value Ref Range Status   SARS Coronavirus 2 by RT PCR NEGATIVE NEGATIVE Final    Comment:  (NOTE) SARS-CoV-2 target nucleic acids are NOT DETECTED.  The SARS-CoV-2 RNA is generally detectable in upper respiratory specimens during the acute phase of infection. The lowest concentration of SARS-CoV-2 viral copies this assay can detect is 138 copies/mL. A negative result does not preclude SARS-Cov-2 infection and should not be used as the sole basis for treatment or other patient management decisions. A negative result may occur with  improper specimen collection/handling, submission of specimen other than nasopharyngeal swab, presence of viral mutation(s) within the areas targeted by this assay, and inadequate number of viral copies(<138 copies/mL). A negative result must be combined with clinical observations, patient history, and epidemiological information. The expected result is Negative.  Fact Sheet for Patients:  EntrepreneurPulse.com.au  Fact Sheet for Healthcare Providers:  IncredibleEmployment.be  This test is no t yet approved or cleared by the [place] FDA and  has been authorized for detection and/or diagnosis of SARS-CoV-2 by FDA under an Emergency Use Authorization (EUA). This EUA will remain  in effect (meaning this test can be used) for the duration of the COVID-19 declaration under Section 564(b)(1) of the Act, 21 U.S.C.section 360bbb-3(b)(1), unless the authorization is terminated  or revoked sooner.       Influenza A by PCR NEGATIVE NEGATIVE Final   Influenza B by PCR NEGATIVE NEGATIVE Final    Comment: (NOTE) The Xpert Xpress SARS-CoV-2/FLU/RSV plus assay is intended as an aid in the diagnosis of influenza from Nasopharyngeal swab specimens and should not be used as a sole basis for treatment. Nasal washings and aspirates are unacceptable for Xpert Xpress SARS-CoV-2/FLU/RSV testing.  Fact Sheet for Patients: EntrepreneurPulse.com.au  Fact Sheet for Healthcare  Providers: IncredibleEmployment.be  This test is not yet approved or cleared by the [place] FDA and has been authorized for detection and/or diagnosis of SARS-CoV-2 by FDA under an Emergency Use Authorization (EUA). This EUA will  remain in effect (meaning this test can be used) for the duration of the COVID-19 declaration under Section 564(b)(1) of the Act, 21 U.S.C. section 360bbb-3(b)(1), unless the authorization is terminated or revoked.  Performed at [facility], [address], [place], [place] [address]   Surgical pcr screen     Status: None   Collection Time: 09/07/20  8:11 AM   Specimen: Nasal Mucosa; Nasal Swab  Result Value Ref Range Status   MRSA, PCR NEGATIVE NEGATIVE Final   Staphylococcus aureus NEGATIVE NEGATIVE Final    Comment: (NOTE) The Xpert SA Assay (FDA approved for NASAL specimens in patients 42 years of age and older), is one component of a comprehensive surveillance program. It is not intended to diagnose infection nor to guide or monitor treatment. Performed at [facility], [place] [address], [place], [place] [address]   Aerobic/Anaerobic Culture (surgical/deep wound)     Status: None (Preliminary result)   Collection Time: 09/07/20  2:25 PM   Specimen: Pleural, Left; Lung  Result Value Ref Range Status   Specimen Description TISSUE LEFT PLEURAL  Final   Special Requests NONE  Final   Gram Stain   Final    FEW WBC PRESENT,BOTH PMN AND MONONUCLEAR NO ORGANISMS SEEN    Culture   Final    NO GROWTH 3 DAYS NO ANAEROBES ISOLATED; CULTURE IN PROGRESS FOR 5 DAYS Performed at [facility], [place] [address], [place], [place] [address]    Report Status PENDING  Incomplete         Radiology Studies: DG CHEST PORT 1 VIEW  Result Date: 09/10/2020 CLINICAL DATA:  Chest tube. EXAM: PORTABLE CHEST 1 VIEW COMPARISON:  September 09, 2020. FINDINGS: Stable cardiomegaly. Right lung is clear. Stable 2 left-sided chest  tubes are noted. No pneumothorax is noted. Stable loculated left pleural effusion is noted along the left lateral chest wall. Stable left basilar atelectasis or infiltrate is noted. Bony thorax is unremarkable. IMPRESSION: Stable position of 2 left-sided chest tubes. Stable loculated left pleural effusion is noted along the left lateral chest wall. Stable left basilar atelectasis or infiltrate. Electronically Signed   By: Marijo Conception M.D.   On: 09/10/2020 08:04   DG CHEST PORT 1 VIEW  Result Date: 09/09/2020 CLINICAL DATA:  Recent empyema with chest tubes in place EXAM: PORTABLE CHEST 1 VIEW COMPARISON:  September 08, 2020 FINDINGS: Chest tube positions on the left are stable. No pneumothorax appreciable. Apparent kink in more superiorly placed chest tube on the left is stable. Loculated fluid on the left is unchanged. Areas of atelectatic change again noted in the left upper lobe. Right lung is clear. Heart is mildly enlarged with normal pulmonary vascularity. No adenopathy. No bone lesions. IMPRESSION: Stable chest tube positions on the left. Loculated pleural fluid without pneumothorax. Atelectasis left upper lobe. No new opacity. Right lung clear. Stable cardiac silhouette. Electronically Signed   By: Lowella Grip III M.D.   On: 09/09/2020 08:09        Scheduled Meds: . acetaminophen  1,000 mg Oral Q6H   Or  . acetaminophen (TYLENOL) oral liquid 160 mg/5 mL  1,000 mg Oral Q6H  . bisacodyl  10 mg Oral Daily  . Chlorhexidine Gluconate Cloth  6 each Topical Daily  . enoxaparin (LOVENOX) injection  40 mg Subcutaneous Daily  . insulin aspart  0-24 Units Subcutaneous Q4H  . senna-docusate  1 tablet Oral QHS   Continuous Infusions: . sodium chloride    .  ceFEPime (MAXIPIME) IV 2 g (09/10/20 0859)  . dextrose 5 % and 0.9% NaCl 75 mL/hr at 09/10/20 0400  . metronidazole 500 mg (09/10/20 1228)     LOS: 4 days    Time spent: 25 minutes    Edwin Dada, MD [facility] 09/10/2020, 2:03 PM     Please page though [place] or Epic secure chat:  For [employer], Adult nurse

## 2020-09-10 NOTE — Progress Notes (Addendum)
      [address]       [place] [address]             [phone]       3 Days Post-Op Procedure(s) (LRB): VIDEO ASSISTED THORACOSCOPY (VATS)/THOROCOTOMY (Left) EMPYEMA DRAINAGE (Left) DECORTICATION (Left)  Subjective: Patient with incisional pain this am.  Objective: Vital signs in last 24 hours: Temp:  [97.7 F (36.5 C)-98.9 F (37.2 C)] 98.9 F (37.2 C) (02/14 0348) Pulse Rate:  [61-89] 82 (02/14 0348) Cardiac Rhythm: Normal sinus rhythm (02/14 0713) Resp:  [16-20] 16 (02/14 0600) BP: (118-149)/(82-95) 125/82 (02/14 0348) SpO2:  [93 %-98 %] 95 % (02/14 0600)     Intake/Output from previous day: 02/13 0701 - 02/14 0700 In: 2605 [P.O.:356; I.V.:1649; IV Piggyback:600] Out: 110 [Chest Tube:110]   Physical Exam:  Cardiovascular: RRR Pulmonary: Clear to auscultation on the right and diminished left basilar breath sounds Wounds: Aquacel removed and staples intact. There is minor bloody like ooze middle of incision and there is a small space where there is no staple so superficially dehisced but no sign of infection Chest Tubes:to suction, no air leak  Lab Results: CBC: Recent Labs    09/09/20 0116 09/10/20 0026  WBC 11.2* 9.9  HGB 10.0* 9.9*  HCT 30.1* 30.5*  PLT 264 294   BMET:  Recent Labs    09/09/20 0116 09/10/20 0026  NA 136 138  K 3.6 4.0  CL 103 106  CO2 25 25  GLUCOSE 150* 114*  BUN 9 9  CREATININE 0.64 0.61  CALCIUM 8.2* 8.2*    PT/INR: No results for input(s): LABPROT, INR in the last 72 hours. ABG:  INR: Will add last result for INR, ABG once components are confirmed Will add last 4 CBG results once components are confirmed  Assessment/Plan:  1. CV - SR 2.  Pulmonary - On 2 liters of oxygen via [place]. Wean as able. Chest tubes with 110 cc of output since surgery. Chest tubes are to suction. There is no air leak. CXR this am is stable. Hope to remove 1 chest tube.  Encourage incentive spirometer. 3. ID-on Cefepime,  and Flagyl. OR culture shows no growth to date 4. Anemia-H and H this am decreased to 9.9 and 30.5  Donielle M ZimmermanPA-C 09/10/2020,7:39 AM [phone]   Chart reviewed, patient examined, agree with above. Chest tube output low. Will remove posterior tube today. CXR is stable. She still has a lot of volume loss on the left and needs to work on the IS and ambulate. I don't think there is significant residual effusion, just pleural thickening from edema. Add Toradol for pain.

## 2020-09-11 ENCOUNTER — Inpatient Hospital Stay (HOSPITAL_COMMUNITY): Payer: Medicaid Other

## 2020-09-11 DIAGNOSIS — J869 Pyothorax without fistula: Secondary | ICD-10-CM | POA: Diagnosis not present

## 2020-09-11 LAB — CBC
HCT: 29.4 % — ABNORMAL LOW (ref 36.0–46.0)
Hemoglobin: 9.4 g/dL — ABNORMAL LOW (ref 12.0–15.0)
MCH: 27.2 pg (ref 26.0–34.0)
MCHC: 32 g/dL (ref 30.0–36.0)
MCV: 85 fL (ref 80.0–100.0)
Platelets: 333 10*3/uL (ref 150–400)
RBC: 3.46 MIL/uL — ABNORMAL LOW (ref 3.87–5.11)
RDW: 15.6 % — ABNORMAL HIGH (ref 11.5–15.5)
WBC: 9.3 10*3/uL (ref 4.0–10.5)
nRBC: 0 % (ref 0.0–0.2)

## 2020-09-11 LAB — GLUCOSE, CAPILLARY
Glucose-Capillary: 101 mg/dL — ABNORMAL HIGH (ref 70–99)
Glucose-Capillary: 122 mg/dL — ABNORMAL HIGH (ref 70–99)
Glucose-Capillary: 101 mg/dL — ABNORMAL HIGH (ref 70–99)
Glucose-Capillary: 101 mg/dL — ABNORMAL HIGH (ref 70–99)
Glucose-Capillary: 93 mg/dL (ref 70–99)

## 2020-09-11 LAB — BASIC METABOLIC PANEL
Anion gap: 7 (ref 5–15)
BUN: 7 mg/dL — ABNORMAL LOW (ref 8–23)
CO2: 27 mmol/L (ref 22–32)
Calcium: 8.3 mg/dL — ABNORMAL LOW (ref 8.9–10.3)
Chloride: 103 mmol/L (ref 98–111)
Creatinine, Ser: 0.59 mg/dL (ref 0.44–1.00)
GFR, Estimated: >60 mL/min (ref >60)
Glucose, Bld: 102 mg/dL — ABNORMAL HIGH (ref 70–99)
Potassium: 3.6 mmol/L (ref 3.5–5.1)
Sodium: 137 mmol/L (ref 135–145)

## 2020-09-11 MED ORDER — PANTOPRAZOLE SODIUM 40 MG PO TBEC
80.0000 mg | DELAYED_RELEASE_TABLET | Freq: Every day | ORAL | Status: DC
  Administered 2020-09-11 – 2020-09-12 (×2): 80 mg via ORAL
  Filled 2020-09-11 (×2): qty 2

## 2020-09-11 MED ORDER — AMOXICILLIN-POT CLAVULANATE 875-125 MG PO TABS
1.0000 | ORAL_TABLET | Freq: Two times a day (BID) | ORAL | Status: DC
  Administered 2020-09-11 – 2020-09-12 (×2): 1 via ORAL
  Filled 2020-09-11 (×2): qty 1

## 2020-09-11 MED ORDER — HYDROCHLOROTHIAZIDE 12.5 MG PO CAPS
12.5000 mg | ORAL_CAPSULE | Freq: Every day | ORAL | Status: DC
  Administered 2020-09-11 – 2020-09-12 (×2): 12.5 mg via ORAL
  Filled 2020-09-11 (×2): qty 1

## 2020-09-11 MED ORDER — OMEPRAZOLE 20 MG PO CPDR
40.0000 mg | DELAYED_RELEASE_CAPSULE | Freq: Every day | ORAL | Status: DC
  Filled 2020-09-11: qty 2

## 2020-09-11 MED ORDER — NON FORMULARY: 40.0000 mg | Freq: Every day | Status: DC

## 2020-09-11 MED ORDER — LISINOPRIL 20 MG PO TABS
20.0000 mg | ORAL_TABLET | Freq: Every day | ORAL | Status: DC
  Administered 2020-09-11 – 2020-09-12 (×2): 20 mg via ORAL
  Filled 2020-09-11 (×2): qty 1

## 2020-09-11 NOTE — Anesthesia Postprocedure Evaluation (Signed)
Anesthesia Post Note  Patient: Donna Phelps  Procedure(s) Performed: VIDEO ASSISTED THORACOSCOPY (VATS)/THOROCOTOMY (Left Chest) EMPYEMA DRAINAGE (Left ) DECORTICATION (Left )     Patient location during evaluation: PACU Anesthesia Type: General Level of consciousness: sedated and patient cooperative Pain management: pain level controlled Vital Signs Assessment: post-procedure vital signs reviewed and stable Respiratory status: spontaneous breathing Cardiovascular status: stable Anesthetic complications: no   No complications documented.  Last Vitals:  Vitals:   09/10/20 2320 09/11/20 0350  BP: (!) 165/98 (!) 176/93  Pulse:  72  Resp: 18 20  Temp: 36.7 C 36.6 C  SpO2: 94% 98%    Last Pain:  Vitals:   09/11/20 0350  TempSrc: Oral  PainSc:                  Lewie Loron

## 2020-09-11 NOTE — Progress Notes (Addendum)
      [address]       [place] [address]             [phone]      4 Days Post-Op Procedure(s) (LRB): VIDEO ASSISTED THORACOSCOPY (VATS)/THOROCOTOMY (Left) EMPYEMA DRAINAGE (Left) DECORTICATION (Left)   Subjective:  No new complaints.  Patient is hoping to get chest tube out soon.  Objective: Vital signs in last 24 hours: Temp:  [97.8 F (36.6 C)-98.5 F (36.9 C)] 97.8 F (36.6 C) (02/15 0350) Pulse Rate:  [68-74] 72 (02/15 0350) Cardiac Rhythm: Normal sinus rhythm (02/15 0709) Resp:  [15-20] 20 (02/15 0350) BP: (137-176)/(82-98) 176/93 (02/15 0350) SpO2:  [93 %-98 %] 98 % (02/15 0350)  Intake/Output from previous day: 02/14 0701 - 02/15 0700 In: 290 [I.V.:290] Out: 50 [Chest Tube:50] Intake/Output this shift: Total I/O In: 120 [P.O.:120] Out: -   General appearance: alert, cooperative and no distress Heart: regular rate and rhythm Lungs: clear to auscultation bilaterally Abdomen: soft, non-tender; bowel sounds normal; no masses,  no organomegaly Extremities: extremities normal, atraumatic, no cyanosis or edema Wound: clean and dry  Lab Results: Recent Labs    09/10/20 0026 09/11/20 0025  WBC 9.9 9.3  HGB 9.9* 9.4*  HCT 30.5* 29.4*  PLT 294 333   BMET:  Recent Labs    09/10/20 0026 09/11/20 0025  NA 138 137  K 4.0 3.6  CL 106 103  CO2 25 27  GLUCOSE 114* 102*  BUN 9 7*  CREATININE 0.61 0.59  CALCIUM 8.2* 8.3*    PT/INR: No results for input(s): LABPROT, INR in the last 72 hours. ABG    Component Value Date/Time   PHART 7.383 09/08/2020 0224   HCO3 28.4 (H) 09/08/2020 0224   O2SAT 96.7 09/08/2020 0224   CBG (last 3)  Recent Labs    09/10/20 2358 09/11/20 0354 09/11/20 0819  GLUCAP 97 101* 122*    Assessment/Plan: S/P Procedure(s) (LRB): VIDEO ASSISTED THORACOSCOPY (VATS)/THOROCOTOMY (Left) EMPYEMA DRAINAGE (Left) DECORTICATION (Left)  1. CV- NSR 2. Pulm-wean oxygen as tolerated, CT output 50 cc output  since surgery ( level currently at 600)- no air leak present, CXR remains stable, needs aggressive pulmonary toilet IS 3. ID- afebrile, OR cultures no growth to date, ABX per primary 4. Dispo- patient stable, no air leak from CT, output remains low, CXR is stable, patient needs to use IS, ABX per primary, possibly d/c final chest tube tomorrow if output remains low   LOS: 5 days    Lowella Dandy, PA-C 09/11/2020   Chart reviewed, patient examined, agree with above. CXR stable. No output from remaining chest tube today so will remove it. 2V CXR in am. If that looks stable she could go home on oral antibiotics when ok with medicine team.

## 2020-09-11 NOTE — Plan of Care (Signed)

## 2020-09-11 NOTE — Progress Notes (Signed)
Pharmacy Antibiotic Note  Donna Phelps is a 60 y.o. female presented to [facility] on 09/05/20 with SOB, found to have extensive loculated pleural effusion with collapse of the left lung.  Patient transferred to [facility] on 09/06/20 and underwent left thoracotomy, decortication with drainage on 09/07/20.   Pharmacy has been consulted for cefepime dosing.  Patient is also on Flagyl.  Renal function stable, afebrile, WBC 9.3.  Plan:  Cefepime 2gm IV Q8H Flagyl 500mg  IV Q8H per MD Monitor renal fxn, clinical progress  Height: 5\' 6"  (167.6 cm) Weight: 75.8 kg (167 lb) IBW/kg (Calculated) : 59.3  Temp (24hrs), Avg:98.1 F (36.7 C), Min:97.8 F (36.6 C), Max:98.5 F (36.9 C)  Recent Labs  Lab 09/05/20 1312 09/06/20 1058 09/07/20 0029 09/08/20 0355 09/09/20 0116 09/10/20 0026 09/11/20 0025  WBC 17.9*   < > 15.4* 15.9* 11.2* 9.9 9.3  CREATININE 0.67   < > 0.70 0.63 0.64 0.61 0.59  LATICACIDVEN 1.1  --   --   --   --   --   --    < > = values in this interval not displayed.    Estimated Creatinine Clearance: 76.8 mL/min (by C-G formula based on SCr of 0.59 mg/dL).    No Known Allergies  Cefepime 2/11 >> Flagyl 2/11 >> Azith 2/11 >> 2/13  2/11 surgical PCR - negative 2/11 pleural - NGTD 2/9 BCx - Neg   4/11, BCPS, [facility] Clinical Pharmacist  09/11/2020 8:15 AM   [facility] pharmacy phone numbers are listed on [email or web address]

## 2020-09-11 NOTE — Progress Notes (Signed)
[facility] PROGRESS NOTE    Chantilly Linskey  WIO:[id number] DOB: [birth year] DOA: 09/06/2020 PCP: Frazier Richards, MD      Brief Narrative:  Mrs. Califano is a 60 y.o. F with HTN, asthma mild intermittent, smoking, hep C s/p Harvoni and recent right THA who presented with cough, shortness of breath, chest pain, fever, and chills.  She was febrile in the ER and CT chest showed a large loculated pleural effusion with empyema.  Case was discussed with CT surgery who recommended transfer for VATS.  2/11: S/p left thoracotomy and decortication of the left lung with 2 chest tubes 2/14: Posterior chest tube removed          Assessment & Plan:  Empyema Sepsis, resolved Patient met sepsis criteria on admission with fever, leukocytosis and loculated pleural effusion.  Completed 7 days cefepime and Flagyl.  Now weaned off O2 and second chest tube coming out.  -Transition to Augmentin -Discontinue remaining chest tube -X-ray in AM, if cleared by CT surgery, home tomorrow with extended course abx and CT surgery follow up    Hypertension BP elevated -Resume lisinopril, HCTZ   Recent right totalhip replacment -Continue Lovenox for DVT prophylaxis   Acute blood loss anemia Hgb stable.  No further clinical bleeding.     Asthma Chart histry, no evidence of exacerbation here.         Disposition: Status is: Inpatient  Remains inpatient appropriate because:Inpatient level of care appropriate due to severity of illness   Dispo: The patient is from: Home              Anticipated d/c is to: Home              Anticipated d/c date is: 1 day              Patient currently is not medically stable to d/c.   Difficult to place patient No       Level of care: Med-Surg   Patient minute with sepsis and empyema.  Had thoracotomy and chest tubes placed.  She is now resolving, weaned off of oxygen.  Her second chest tube will be removed today, and if  she is tolerating orals well tomorrow, weaned off oxygen, chest x-ray stable, likely home tomorrow.        MDM: The below labs and imaging reports were reviewed and summarized above.  Medication management as above.   DVT prophylaxis: enoxaparin (LOVENOX) injection 40 mg Start: 09/08/20 1000 SCD's Start: 09/07/20 2013  Code Status: FULL Family Communication:     Consultants:   Cardiothoracic surgery  Procedures:   2/11 thoracotomy  Antimicrobials:   Ceftriaxone 2/9-2/10  Cefepime 2/11>>2/15  Azithromycin 2/9-2/13  Flagyl 2/9>> 2/15   Augmentin 2/15 >>  Culture data:   2/9 blood culture no growth to date  2/11 thoracotomy culture no growth to date          Subjective: Chest pain is better after getting the chest tubes out.  Tired, no fever, appetite better, no confusion or dyspnea.    Objective: Vitals:   09/10/20 1954 09/10/20 2320 09/11/20 0350 09/11/20 1609  BP: 137/84 (!) 165/98 (!) 176/93 (!) 151/102  Pulse: 74  72 73  Resp: '15 18 20 19  ' Temp: 98.1 F (36.7 C) 98 F (36.7 C) 97.8 F (36.6 C) 98.4 F (36.9 C)  TempSrc: Oral Oral Oral Oral  SpO2: 94% 94% 98% 93%  Weight:      Height:  Intake/Output Summary (Last 24 hours) at 09/11/2020 3536 Last data filed at 09/11/2020 1604 Gross per 24 hour  Intake 410.03 ml  Output 0 ml  Net 410.03 ml   Filed Weights   09/06/20 1100 09/07/20 1132  Weight: 75.8 kg 75.8 kg    Examination: General appearance: Adult female, sitting up in bed, no acute distress     HEENT:    Skin:  Cardiac: RRR, no murmurs, no lower extremity edema Respiratory: Normal respiratory rate and rhythm, lung sounds diminished, worse on the left, no crackles or wheezing.  Chest tubes removed. Abdomen: Abdomen soft without tenderness palpation or guarding. MSK:  Neuro: Awake and alert, extraocular movements intact, moves all extremities with normal strength and coordination, speech fluent Psych: Sensorium  intact responding questions, attention normal, affect normal, judgment insight appear normal     Data Reviewed: I have personally reviewed following labs and imaging studies:  CBC: Recent Labs  Lab 09/06/20 1058 09/07/20 0029 09/08/20 0355 09/09/20 0116 09/10/20 0026 09/11/20 0025  WBC 16.4* 15.4* 15.9* 11.2* 9.9 9.3  NEUTROABS 13.1*  --   --   --   --   --   HGB 11.7* 10.6* 10.3* 10.0* 9.9* 9.4*  HCT 36.8 32.3* 32.4* 30.1* 30.5* 29.4*  MCV 85.4 85.4 86.6 86.0 85.2 85.0  PLT 225 234 256 264 294 144   Basic Metabolic Panel: Recent Labs  Lab 09/06/20 1058 09/07/20 0029 09/08/20 0355 09/09/20 0116 09/10/20 0026 09/11/20 0025  NA 137 136 136 136 138 137  K 3.2* 3.6 3.9 3.6 4.0 3.6  CL 101 100 99 103 106 103  CO2 '26 26 26 25 25 27  ' GLUCOSE 113* 108* 145* 150* 114* 102*  BUN '11 10 14 9 9 ' 7*  CREATININE 0.74 0.70 0.63 0.64 0.61 0.59  CALCIUM 8.4* 8.4* 8.7* 8.2* 8.2* 8.3*  MG 1.7  --  2.1  --   --   --   PHOS  --   --  3.1  --   --   --    GFR: Estimated Creatinine Clearance: 76.8 mL/min (by C-G formula based on SCr of 0.59 mg/dL). Liver Function Tests: Recent Labs  Lab 09/06/20 1058 09/08/20 0355 09/09/20 0116  AST 22  --  24  ALT 19  --  21  ALKPHOS 61  --  53  BILITOT 0.5  --  0.2*  PROT 6.9  --  5.4*  ALBUMIN 2.6* 2.2* 2.0*   No results for input(s): LIPASE, AMYLASE in the last 168 hours. No results for input(s): AMMONIA in the last 168 hours. Coagulation Profile: No results for input(s): INR, PROTIME in the last 168 hours. Cardiac Enzymes: No results for input(s): CKTOTAL, CKMB, CKMBINDEX, TROPONINI in the last 168 hours. BNP (last 3 results) No results for input(s): PROBNP in the last 8760 hours. HbA1C: No results for input(s): HGBA1C in the last 72 hours. CBG: Recent Labs  Lab 09/10/20 2358 09/11/20 0354 09/11/20 0819 09/11/20 1130 09/11/20 1618  GLUCAP 97 101* 122* 101* 101*   Lipid Profile: No results for input(s): CHOL, HDL, LDLCALC,  TRIG, CHOLHDL, LDLDIRECT in the last 72 hours. Thyroid Function Tests: No results for input(s): TSH, T4TOTAL, FREET4, T3FREE, THYROIDAB in the last 72 hours. Anemia Panel: No results for input(s): VITAMINB12, FOLATE, FERRITIN, TIBC, IRON, RETICCTPCT in the last 72 hours. Urine analysis:    Component Value Date/Time   COLORURINE YELLOW (A) 08/23/2019 1309   APPEARANCEUR HAZY (A) 08/23/2019 1309   LABSPEC 1.019 08/23/2019  1309   PHURINE 6.0 08/23/2019 1309   GLUCOSEU NEGATIVE 08/23/2019 1309   HGBUR NEGATIVE 08/23/2019 [place] 08/23/2019 1309   KETONESUR NEGATIVE 08/23/2019 1309   PROTEINUR NEGATIVE 08/23/2019 1309   NITRITE NEGATIVE 08/23/2019 1309   LEUKOCYTESUR SMALL (A) 08/23/2019 1309   Sepsis Labs: '@[email or web address]' (procalcitonin:4,lacticacidven:4)  ) Recent Results (from the past 240 hour(s))  Blood culture (routine x 2)     Status: None   Collection Time: 09/05/20  2:30 PM   Specimen: BLOOD  Result Value Ref Range Status   Specimen Description BLOOD BLOOD LEFT FOREARM  Final   Special Requests   Final    BOTTLES DRAWN AEROBIC AND ANAEROBIC Blood Culture adequate volume   Culture   Final    NO GROWTH 5 DAYS Performed at [facility], [address] [place], [place] [address]    Report Status 09/10/2020 FINAL  Final  Blood culture (routine x 2)     Status: None   Collection Time: 09/05/20  2:45 PM   Specimen: BLOOD  Result Value Ref Range Status   Specimen Description BLOOD LEFT ANTECUBITAL  Final   Special Requests   Final    BOTTLES DRAWN AEROBIC AND ANAEROBIC Blood Culture results may not be optimal due to an excessive volume of blood received in culture bottles   Culture   Final    NO GROWTH 5 DAYS Performed at [facility], [address] [place],  [address]    Report Status 09/10/2020 FINAL  Final  Resp Panel by RT-PCR (Flu A&B, Covid) Nasopharyngeal Swab     Status: None   Collection Time: 09/05/20  4:25 PM    Specimen: Nasopharyngeal Swab; Nasopharyngeal(NP) swabs in vial transport medium  Result Value Ref Range Status   SARS Coronavirus 2 by RT PCR NEGATIVE NEGATIVE Final    Comment: (NOTE) SARS-CoV-2 target nucleic acids are NOT DETECTED.  The SARS-CoV-2 RNA is generally detectable in upper respiratory specimens during the acute phase of infection. The lowest concentration of SARS-CoV-2 viral copies this assay can detect is 138 copies/mL. A negative result does not preclude SARS-Cov-2 infection and should not be used as the sole basis for treatment or other patient management decisions. A negative result may occur with  improper specimen collection/handling, submission of specimen other than nasopharyngeal swab, presence of viral mutation(s) within the areas targeted by this assay, and inadequate number of viral copies(<138 copies/mL). A negative result must be combined with clinical observations, patient history, and epidemiological information. The expected result is Negative.  Fact Sheet for Patients:  EntrepreneurPulse.com.au  Fact Sheet for Healthcare Providers:  IncredibleEmployment.be  This test is no t yet approved or cleared by the [place] FDA and  has been authorized for detection and/or diagnosis of SARS-CoV-2 by FDA under an Emergency Use Authorization (EUA). This EUA will remain  in effect (meaning this test can be used) for the duration of the COVID-19 declaration under Section 564(b)(1) of the Act, 21 U.S.C.section 360bbb-3(b)(1), unless the authorization is terminated  or revoked sooner.       Influenza A by PCR NEGATIVE NEGATIVE Final   Influenza B by PCR NEGATIVE NEGATIVE Final    Comment: (NOTE) The Xpert Xpress SARS-CoV-2/FLU/RSV plus assay is intended as an aid in the diagnosis of influenza from Nasopharyngeal swab specimens and should not be used as a sole basis for treatment. Nasal washings and aspirates are  unacceptable for Xpert Xpress SARS-CoV-2/FLU/RSV testing.  Fact Sheet for Patients: EntrepreneurPulse.com.au  Fact Sheet for Healthcare Providers: IncredibleEmployment.be  This test is not yet approved or cleared by the [place] FDA and has been authorized for detection and/or diagnosis of SARS-CoV-2 by FDA under an Emergency Use Authorization (EUA). This EUA will remain in effect (meaning this test can be used) for the duration of the COVID-19 declaration under Section 564(b)(1) of the Act, 21 U.S.C. section 360bbb-3(b)(1), unless the authorization is terminated or revoked.  Performed at [facility], [address] [place], [place] [address]   Surgical pcr screen     Status: None   Collection Time: 09/07/20  8:11 AM   Specimen: Nasal Mucosa; Nasal Swab  Result Value Ref Range Status   MRSA, PCR NEGATIVE NEGATIVE Final   Staphylococcus aureus NEGATIVE NEGATIVE Final    Comment: (NOTE) The Xpert SA Assay (FDA approved for NASAL specimens in patients 24 years of age and older), is one component of a comprehensive surveillance program. It is not intended to diagnose infection nor to guide or monitor treatment. Performed at [facility], [place] [address], [place], [place] [address]   Aerobic/Anaerobic Culture (surgical/deep wound)     Status: None (Preliminary result)   Collection Time: 09/07/20  2:25 PM   Specimen: Pleural, Left; Lung  Result Value Ref Range Status   Specimen Description TISSUE LEFT PLEURAL  Final   Special Requests NONE  Final   Gram Stain   Final    FEW WBC PRESENT,BOTH PMN AND MONONUCLEAR NO ORGANISMS SEEN    Culture   Final    NO GROWTH 4 DAYS NO ANAEROBES ISOLATED; CULTURE IN PROGRESS FOR 5 DAYS Performed at [facility], [place] [address] [place], [place] [address]    Report Status PENDING  Incomplete         Radiology Studies: DG CHEST PORT 1 VIEW  Result Date:  09/11/2020 CLINICAL DATA:  Chest tube. EXAM: PORTABLE CHEST 1 VIEW COMPARISON:  09/10/2020. FINDINGS: Interval removal of 1 of 2 left chest tubes. No pneumothorax. Left-sided loculated pleural fluid and or pleural thickening again noted without interim change. Low lung volumes with bilateral atelectatic changes again noted. Stable cardiomegaly. IMPRESSION: 1. Interval removal of 1 of 2 left chest tubes. No pneumothorax. 2. Left-sided loculated pleural fluid and or pleural thickening again noted without interim change. Low lung volumes with bilateral atelectatic changes again noted. 3. Stable cardiomegaly. Electronically Signed   By: Marcello Moores  Register   On: 09/11/2020 07:54   DG CHEST PORT 1 VIEW  Result Date: 09/10/2020 CLINICAL DATA:  Chest tube. EXAM: PORTABLE CHEST 1 VIEW COMPARISON:  September 09, 2020. FINDINGS: Stable cardiomegaly. Right lung is clear. Stable 2 left-sided chest tubes are noted. No pneumothorax is noted. Stable loculated left pleural effusion is noted along the left lateral chest wall. Stable left basilar atelectasis or infiltrate is noted. Bony thorax is unremarkable. IMPRESSION: Stable position of 2 left-sided chest tubes. Stable loculated left pleural effusion is noted along the left lateral chest wall. Stable left basilar atelectasis or infiltrate. Electronically Signed   By: Marijo Conception M.D.   On: 09/10/2020 08:04        Scheduled Meds: . acetaminophen  1,000 mg Oral Q6H   Or  . acetaminophen (TYLENOL) oral liquid 160 mg/5 mL  1,000 mg Oral Q6H  . amoxicillin-clavulanate  1 tablet Oral Q12H  . bisacodyl  10 mg Oral Daily  . Chlorhexidine Gluconate Cloth  6 each Topical Daily  . enoxaparin (LOVENOX) injection  40  mg Subcutaneous Daily  . hydrochlorothiazide  12.5 mg Oral Daily  . insulin aspart  0-24 Units Subcutaneous Q4H  . lisinopril  20 mg Oral Daily  . pantoprazole  80 mg Oral Daily  . senna-docusate  1 tablet Oral QHS   Continuous Infusions: . sodium  chloride       LOS: 5 days    Time spent: 25 minutes    Edwin Dada, MD [facility] 09/11/2020, 7:23 PM     Please page though [place] or Epic secure chat:  For [employer], Adult nurse

## 2020-09-12 ENCOUNTER — Inpatient Hospital Stay (HOSPITAL_COMMUNITY): Payer: Medicaid Other

## 2020-09-12 ENCOUNTER — Other Ambulatory Visit: Payer: Self-pay | Admitting: Internal Medicine

## 2020-09-12 DIAGNOSIS — J869 Pyothorax without fistula: Secondary | ICD-10-CM | POA: Diagnosis not present

## 2020-09-12 DIAGNOSIS — E876 Hypokalemia: Secondary | ICD-10-CM | POA: Diagnosis not present

## 2020-09-12 DIAGNOSIS — E8809 Other disorders of plasma-protein metabolism, not elsewhere classified: Secondary | ICD-10-CM | POA: Diagnosis not present

## 2020-09-12 LAB — ACID FAST SMEAR (AFB, MYCOBACTERIA): Acid Fast Smear: NEGATIVE

## 2020-09-12 LAB — GLUCOSE, CAPILLARY
Glucose-Capillary: 100 mg/dL — ABNORMAL HIGH (ref 70–99)
Glucose-Capillary: 95 mg/dL (ref 70–99)
Glucose-Capillary: 96 mg/dL (ref 70–99)

## 2020-09-12 LAB — AEROBIC/ANAEROBIC CULTURE W GRAM STAIN (SURGICAL/DEEP WOUND): Culture: NO GROWTH

## 2020-09-12 MED ORDER — HYDROCORTISONE 1 % EX OINT
TOPICAL_OINTMENT | Freq: Two times a day (BID) | CUTANEOUS | Status: DC
  Filled 2020-09-12: qty 28

## 2020-09-12 MED ORDER — AMOXICILLIN-POT CLAVULANATE 875-125 MG PO TABS: 1.0000 | ORAL_TABLET | Freq: Two times a day (BID) | ORAL | 0 refills | Status: AC

## 2020-09-12 MED ORDER — OXYCODONE HCL 5 MG PO TABS: 5.0000 mg | ORAL_TABLET | ORAL | 0 refills | Status: DC | PRN

## 2020-09-12 MED ORDER — OXYCODONE HCL 5 MG PO TABS: 5.0000 mg | ORAL_TABLET | ORAL | 0 refills | Status: AC | PRN

## 2020-09-12 MED ORDER — METOCLOPRAMIDE HCL 5 MG/ML IJ SOLN
10.0000 mg | Freq: Four times a day (QID) | INTRAMUSCULAR | Status: DC
  Administered 2020-09-12 (×2): 10 mg via INTRAVENOUS
  Filled 2020-09-12 (×2): qty 2

## 2020-09-12 MED ORDER — AMOXICILLIN-POT CLAVULANATE 875-125 MG PO TABS: 1.0000 | ORAL_TABLET | Freq: Two times a day (BID) | ORAL | 0 refills | Status: DC

## 2020-09-12 MED FILL — AMOX-CLAV 875-125 MG TABLET: 875-125 | 14 days supply | Qty: 28 | Fill #0

## 2020-09-12 MED FILL — oxyCODONE HCL 5 MG TABS: 5 | 2 days supply | Qty: 15 | Fill #0

## 2020-09-12 NOTE — Plan of Care (Signed)

## 2020-09-12 NOTE — Discharge Summary (Signed)
Physician Discharge Summary  Donna Phelps VOH:[id number] DOB: January 08, 1961 DOA: 09/06/2020  PCP: Frazier Richards, MD  Admit date: 09/06/2020 Discharge date: 09/12/2020  Admitted From: home Disposition:  home  Recommendations for Outpatient Follow-up:  1. Follow up with PCP in 1-2 weeks 2. Follow up with cardiothoracic surgery as scheduled  Home Health: none Equipment/Devices: none  Discharge Condition: stable CODE STATUS: Full code Diet recommendation: regular  HPI: Per admitting MD, Donna Phelps is a 60 y.o. F with HTN, asthma mild intermittent, smoking, hep C s/p Harvoni and recent right THA who presented with cough, shortness of breath, chest pain, fever, and chills. She was febrile in the ER and CT chest showed a large loculated pleural effusion with empyema.  Case was discussed with CT surgery who recommended transfer for VATS. 2/11: S/p left thoracotomy and decortication of the left lung with 2 chest tubes 2/14: Posterior chest tube removed  Hospital Course / Discharge diagnoses: Principal problem Empyema Sepsis, resolved -Patient met sepsis criteria on admission with fever, leukocytosis and loculated pleural effusion. Completed 7 days cefepime and Flagyl, no on Augmentin. S/p VATS per cardiothoracic surgery on 2/11.  Now weaned off O2 and s/p chest tube removal. CXR without complicating features d/w thoracic surgery OK for home dc on oral Augmentin  Hypertension-resume home medications  Recent right totalhip replacment -Continue Lovenox for DVT prophylaxis, outpatient follow up Acute blood loss anemia -Hgb stable.  No further clinical bleeding.  Asthma- Chart histry, no evidence of exacerbation here.  Discharge Instructions   Allergies as of 09/12/2020   No Known Allergies     Medication List    TAKE these medications   acetaminophen 500 MG tablet Commonly known as: TYLENOL Take 1,000 mg by mouth every 6 (six) hours as needed for mild pain.   albuterol 108  (90 Base) MCG/ACT inhaler Commonly known as: VENTOLIN HFA Inhale 2 puffs into the lungs every 6 (six) hours as needed for wheezing or shortness of breath.   amoxicillin-clavulanate 875-125 MG tablet Commonly known as: AUGMENTIN Take 1 tablet by mouth every 12 (twelve) hours for 12 days.   enoxaparin 40 MG/0.4ML injection Commonly known as: LOVENOX Inject 0.4 mLs (40 mg total) into the skin daily for 10 days.   ferrous IRSWNIOE-[id number] C-folic acid capsule Commonly known as: TRINSICON / FOLTRIN Take 1 capsule by mouth 2 (two) times daily after a meal.   lisinopril-hydrochlorothiazide 20-12.5 MG tablet Commonly known as: ZESTORETIC Take 1 tablet by mouth daily.   naproxen 500 MG tablet Commonly known as: NAPROSYN Take 500 mg by mouth See admin instructions. Take 1 tablet (536m) twice daily for 2 weeks, then as needed.   omeprazole 40 MG capsule Commonly known as: PRILOSEC Take 40 mg by mouth daily.   oxyCODONE 5 MG immediate release tablet Commonly known as: Roxicodone Take 1-2 tablets (5-10 mg total) by mouth every 4 (four) hours as needed for severe pain.       Follow-up Information    BGaye Pollack MD. Go on 09/26/2020.   Specialty: Cardiothoracic Surgery Why: [facility] on 03/02 at 8:30 am;Appointment time is at 9:00 am Contact information: [phone]              Consultations:  Cardiothoracic surgery   Procedures/Studies:  DG Chest 2 View  Result Date: 09/12/2020 CLINICAL DATA:  Empyema  EXAM: CHEST - 2 VIEW COMPARISON:  09/11/2020 FINDINGS: Left chest tube has been removed. Left pleural effusion again noted which appears loculated laterally, unchanged. Airspace disease in the mid and lower left lung, stable. Right base atelectasis. Heart is borderline in size. IMPRESSION: Stable loculated left pleural  effusion and left lung airspace disease. Right base atelectasis. Interval removal of left chest tube without pneumothorax. Electronically Signed   By: Rolm Baptise M.D.   On: 09/12/2020 06:46   DG Chest 2 View  Result Date: 09/05/2020 CLINICAL DATA:  Chest pain and shortness of breath EXAM: CHEST - 2 VIEW COMPARISON:  April 24 2015 FINDINGS: There is extensive opacity throughout much of the left lung with multiple areas of lobular opacity throughout the left upper lung peripherally and mid to lower lung region more centrally. Right lung is clear. The heart is borderline enlarged with pulmonary vascularity normal. No adenopathy. No bone lesions. IMPRESSION: Extensive opacity on the left. At least some of this opacity is felt to be due to loculated pleural effusion. There may well be areas of superimposed infiltrate on the left. Underlying mass lesions cannot be excluded given the lobular opacity in several areas on the left. Given this appearance of the left lung, correlation with contrast enhanced chest CT is felt to be advisable. Right lung is clear. Heart is borderline enlarged. No adenopathy evident by radiography. Electronically Signed   By: Lowella Grip III M.D.   On: 09/05/2020 13:39   CT CHEST W CONTRAST  Result Date: 09/05/2020 CLINICAL DATA:  Shortness of breath. Chest pain. Headache. Abnormal radiography on the left. EXAM: CT CHEST WITH CONTRAST TECHNIQUE: Multidetector CT imaging of the chest was performed during intravenous contrast administration. CONTRAST:  78m OMNIPAQUE IOHEXOL 300 MG/ML  SOLN COMPARISON:  Chest radiography earlier same day. Previous chest CT 04/02/2010. FINDINGS: Cardiovascular: Heart size is normal. Some coronary artery calcification is evident in the left system. Some aortic atherosclerotic calcification is present. Mediastinum/Nodes: Mild reactive nodal enlargement. Lungs/Pleura: The right lung is clear except for mild atelectasis at the posterior right base. There  is a tiny amount of pleural fluid on the right, including a small amount within the major fissure. On the left, there is extensive loculated pleural fluid consistent with empyema. This is present along both the lateral and medial pleural spaces. Question if there could be some early extension into the mediastinum anteriorly. Infiltrate/collapse affects the left lower lobe. Some volume loss in the left upper lobe as well. Upper Abdomen: Right renal cyst.  No acute upper abdominal finding. Musculoskeletal: Ordinary thoracic degenerative changes. IMPRESSION: 1. Extensive loculated pleural fluid on the left consistent with empyema. This is present along both the lateral and medial pleural spaces. Question if there could be some early extension into the mediastinum anteriorly. 2. Infiltrate/collapse affects the left lower lobe. Some volume loss in the left upper lobe as well. 3. Tiny amount of pleural fluid on the right, including a small amount within the major fissure. 4. Aortic atherosclerosis. Coronary artery calcification. Aortic Atherosclerosis (ICD10-I70.0). Electronically Signed   By: MNelson ChimesM.D.   On: 09/05/2020 17:21   DG CHEST PORT 1 VIEW  Result Date: 09/11/2020 CLINICAL DATA:  Chest tube. EXAM: PORTABLE CHEST 1 VIEW COMPARISON:  09/10/2020. FINDINGS: Interval removal of 1 of 2 left chest tubes. No pneumothorax. Left-sided loculated pleural fluid and or pleural thickening again noted without interim change. Low lung volumes with bilateral atelectatic changes again noted. Stable cardiomegaly. IMPRESSION: 1. Interval removal of  1 of 2 left chest tubes. No pneumothorax. 2. Left-sided loculated pleural fluid and or pleural thickening again noted without interim change. Low lung volumes with bilateral atelectatic changes again noted. 3. Stable cardiomegaly. Electronically Signed   By: Marcello Moores  Register   On: 09/11/2020 07:54   DG CHEST PORT 1 VIEW  Result Date: 09/10/2020 CLINICAL DATA:  Chest tube.  EXAM: PORTABLE CHEST 1 VIEW COMPARISON:  September 09, 2020. FINDINGS: Stable cardiomegaly. Right lung is clear. Stable 2 left-sided chest tubes are noted. No pneumothorax is noted. Stable loculated left pleural effusion is noted along the left lateral chest wall. Stable left basilar atelectasis or infiltrate is noted. Bony thorax is unremarkable. IMPRESSION: Stable position of 2 left-sided chest tubes. Stable loculated left pleural effusion is noted along the left lateral chest wall. Stable left basilar atelectasis or infiltrate. Electronically Signed   By: Marijo Conception M.D.   On: 09/10/2020 08:04   DG CHEST PORT 1 VIEW  Result Date: 09/09/2020 CLINICAL DATA:  Recent empyema with chest tubes in place EXAM: PORTABLE CHEST 1 VIEW COMPARISON:  September 08, 2020 FINDINGS: Chest tube positions on the left are stable. No pneumothorax appreciable. Apparent kink in more superiorly placed chest tube on the left is stable. Loculated fluid on the left is unchanged. Areas of atelectatic change again noted in the left upper lobe. Right lung is clear. Heart is mildly enlarged with normal pulmonary vascularity. No adenopathy. No bone lesions. IMPRESSION: Stable chest tube positions on the left. Loculated pleural fluid without pneumothorax. Atelectasis left upper lobe. No new opacity. Right lung clear. Stable cardiac silhouette. Electronically Signed   By: Lowella Grip III M.D.   On: 09/09/2020 08:09   DG CHEST PORT 1 VIEW  Result Date: 09/08/2020 CLINICAL DATA:  Previous empyema with chest tubes in place. EXAM: PORTABLE CHEST 1 VIEW COMPARISON:  September 07, 2020 FINDINGS: Chest tube positions on the left are unchanged without evident pneumothorax. Note that the superior chest tube on the left appears kinked, unchanged. There is persistent loculated fluid in the left mid and lower lung regions, similar to 1 day prior. There is atelectatic change in the medial left upper lobe. Right lung is clear. Heart is mildly  enlarged with pulmonary vascularity normal. No adenopathy. No bone lesions. IMPRESSION: Stable chest tube positions on the left. Stable areas of loculated effusion without evident cavitation. Atelectasis left upper lobe medially. No pneumothorax. Right lung clear. Stable cardiac prominence. Electronically Signed   By: Lowella Grip III M.D.   On: 09/08/2020 08:04   DG Chest Port 1 View  Result Date: 09/07/2020 CLINICAL DATA:  Status post left thoracotomy for empyema drainage. EXAM: PORTABLE CHEST 1 VIEW COMPARISON:  CT chest and chest x-ray dated September 05, 2020. FINDINGS: The patient is rotated to the right, accentuating the cardiac silhouette and bilateral hila. Interval decrease in size of the loculated left pleural effusion status post thoracotomy and drainage. Small amount of loculated fluid persists. There are two new left-sided chest tubes. The more superior chest tube is kinked at its apex. No pneumothorax. Mild improvement in aeration at the left lung base with continued atelectasis. No acute osseous abnormality. IMPRESSION: 1. Interval decrease in size of the loculated left pleural effusion status post thoracotomy and drainage with two new left-sided chest tubes. No pneumothorax. 2. Mild improvement in aeration at the left lung base. Electronically Signed   By: Titus Dubin M.D.   On: 09/07/2020 16:12      Subjective: -  no chest pain, shortness of breath, no abdominal pain, nausea or vomiting.   Discharge Exam: BP (!) 145/87 (BP Location: Right Arm)   Pulse 63   Temp 98.3 F (36.8 C)   Resp 20   Ht '5\' 6"'  (1.676 m)   Wt 75.8 kg   SpO2 98%   BMI 26.95 kg/m   General: Pt is alert, awake, not in acute distress Cardiovascular: RRR, S1/S2 +, no rubs, no gallops Respiratory: CTA bilaterally, no wheezing, no rhonchi Abdominal: Soft, NT, ND, bowel sounds + Extremities: no edema, no cyanosis  The results of significant diagnostics from this hospitalization (including imaging,  microbiology, ancillary and laboratory) are listed below for reference.     Microbiology: Recent Results (from the past 240 hour(s))  Blood culture (routine x 2)     Status: None   Collection Time: 09/05/20  2:30 PM   Specimen: BLOOD  Result Value Ref Range Status   Specimen Description BLOOD BLOOD LEFT FOREARM  Final   Special Requests   Final    BOTTLES DRAWN AEROBIC AND ANAEROBIC Blood Culture adequate volume   Culture   Final    NO GROWTH 5 DAYS Performed at [facility], [address] [place], [place] [address]    Report Status 09/10/2020 FINAL  Final  Blood culture (routine x 2)     Status: None   Collection Time: 09/05/20  2:45 PM   Specimen: BLOOD  Result Value Ref Range Status   Specimen Description BLOOD LEFT ANTECUBITAL  Final   Special Requests   Final    BOTTLES DRAWN AEROBIC AND ANAEROBIC Blood Culture results may not be optimal due to an excessive volume of blood received in culture bottles   Culture   Final    NO GROWTH 5 DAYS Performed at [facility], [address] [place], [place] [address]    Report Status 09/10/2020 FINAL  Final  Resp Panel by RT-PCR (Flu A&B, Covid) Nasopharyngeal Swab     Status: None   Collection Time: 09/05/20  4:25 PM   Specimen: Nasopharyngeal Swab; Nasopharyngeal(NP) swabs in vial transport medium  Result Value Ref Range Status   SARS Coronavirus 2 by RT PCR NEGATIVE NEGATIVE Final    Comment: (NOTE) SARS-CoV-2 target nucleic acids are NOT DETECTED.  The SARS-CoV-2 RNA is generally detectable in upper respiratory specimens during the acute phase of infection. The lowest concentration of SARS-CoV-2 viral copies this assay can detect is 138 copies/mL. A negative result does not preclude SARS-Cov-2 infection and should not be used as the sole basis for treatment or other patient management decisions. A negative result may occur with  improper specimen collection/handling, submission of specimen other than  nasopharyngeal swab, presence of viral mutation(s) within the areas targeted by this assay, and inadequate number of viral copies(<138 copies/mL). A negative result must be combined with clinical observations, patient history, and epidemiological information. The expected result is Negative.  Fact Sheet for Patients:  EntrepreneurPulse.com.au  Fact Sheet for Healthcare Providers:  IncredibleEmployment.be  This test is no t yet approved or cleared by the [place] FDA and  has been authorized for detection and/or diagnosis of SARS-CoV-2 by FDA under an Emergency Use Authorization (EUA). This EUA will remain  in effect (meaning this test can be used) for the duration of the COVID-19 declaration under Section 564(b)(1) of the Act, 21 U.S.C.section 360bbb-3(b)(1), unless the authorization is terminated  or revoked sooner.       Influenza A by PCR  NEGATIVE NEGATIVE Final   Influenza B by PCR NEGATIVE NEGATIVE Final    Comment: (NOTE) The Xpert Xpress SARS-CoV-2/FLU/RSV plus assay is intended as an aid in the diagnosis of influenza from Nasopharyngeal swab specimens and should not be used as a sole basis for treatment. Nasal washings and aspirates are unacceptable for Xpert Xpress SARS-CoV-2/FLU/RSV testing.  Fact Sheet for Patients: EntrepreneurPulse.com.au  Fact Sheet for Healthcare Providers: IncredibleEmployment.be  This test is not yet approved or cleared by the [place] FDA and has been authorized for detection and/or diagnosis of SARS-CoV-2 by FDA under an Emergency Use Authorization (EUA). This EUA will remain in effect (meaning this test can be used) for the duration of the COVID-19 declaration under Section 564(b)(1) of the Act, 21 U.S.C. section 360bbb-3(b)(1), unless the authorization is terminated or revoked.  Performed at [facility], [address] [place], [place]  [address]   Surgical pcr screen     Status: None   Collection Time: 09/07/20  8:11 AM   Specimen: Nasal Mucosa; Nasal Swab  Result Value Ref Range Status   MRSA, PCR NEGATIVE NEGATIVE Final   Staphylococcus aureus NEGATIVE NEGATIVE Final    Comment: (NOTE) The Xpert SA Assay (FDA approved for NASAL specimens in patients 104 years of age and older), is one component of a comprehensive surveillance program. It is not intended to diagnose infection nor to guide or monitor treatment. Performed at [facility], [place] [address], [place], [place] [address]   Aerobic/Anaerobic Culture (surgical/deep wound)     Status: None (Preliminary result)   Collection Time: 09/07/20  2:25 PM   Specimen: Pleural, Left; Lung  Result Value Ref Range Status   Specimen Description TISSUE LEFT PLEURAL  Final   Special Requests NONE  Final   Gram Stain   Final    FEW WBC PRESENT,BOTH PMN AND MONONUCLEAR NO ORGANISMS SEEN    Culture   Final    NO GROWTH 4 DAYS NO ANAEROBES ISOLATED; CULTURE IN PROGRESS FOR 5 DAYS Performed at [facility], [place] [address] [place], [place] [address]    Report Status PENDING  Incomplete  Acid Fast Smear (AFB)     Status: None   Collection Time: 09/07/20  2:25 PM   Specimen: Pleural, Left; Lung  Result Value Ref Range Status   AFB Specimen Processing Concentration  Final   Acid Fast Smear Negative  Final    Comment: (NOTE) Performed At: [facility] [place], [place] [id number] Rush Farmer MD XQ:[id number]    Source (AFB) TISSUE  Final    Comment: LEFT PLEURAL Performed at [facility], [place] [address] [place], [place] [address]      Labs: Basic Metabolic Panel: Recent Labs  Lab 09/06/20 1058 09/07/20 0029 09/08/20 0355 09/09/20 0116 09/10/20 0026 09/11/20 0025  NA 137 136 136 136 138 137  K 3.2* 3.6 3.9 3.6 4.0 3.6  CL 101 100 99 103 106 103  CO2 '26 26 26 25 25 27  ' GLUCOSE 113* 108* 145* 150* 114* 102*  BUN '11 10  14 9 9 ' 7*  CREATININE 0.74 0.70 0.63 0.64 0.61 0.59  CALCIUM 8.4* 8.4* 8.7* 8.2* 8.2* 8.3*  MG 1.7  --  2.1  --   --   --   PHOS  --   --  3.1  --   --   --    Liver Function Tests: Recent Labs  Lab 09/06/20 1058 09/08/20 0355  09/09/20 0116  AST 22  --  24  ALT 19  --  21  ALKPHOS 61  --  53  BILITOT 0.5  --  0.2*  PROT 6.9  --  5.4*  ALBUMIN 2.6* 2.2* 2.0*   CBC: Recent Labs  Lab 09/06/20 1058 09/07/20 0029 09/08/20 0355 09/09/20 0116 09/10/20 0026 09/11/20 0025  WBC 16.4* 15.4* 15.9* 11.2* 9.9 9.3  NEUTROABS 13.1*  --   --   --   --   --   HGB 11.7* 10.6* 10.3* 10.0* 9.9* 9.4*  HCT 36.8 32.3* 32.4* 30.1* 30.5* 29.4*  MCV 85.4 85.4 86.6 86.0 85.2 85.0  PLT 225 234 256 264 294 333   CBG: Recent Labs  Lab 09/11/20 1130 09/11/20 1618 09/11/20 2006 09/12/20 0011 09/12/20 0730  GLUCAP 101* 101* 93 100* 96   Hgb A1c No results for input(s): HGBA1C in the last 72 hours. Lipid Profile No results for input(s): CHOL, HDL, LDLCALC, TRIG, CHOLHDL, LDLDIRECT in the last 72 hours. Thyroid function studies No results for input(s): TSH, T4TOTAL, T3FREE, THYROIDAB in the last 72 hours.  Invalid input(s): FREET3 Urinalysis    Component Value Date/Time   COLORURINE YELLOW (A) 08/23/2019 1309   APPEARANCEUR HAZY (A) 08/23/2019 1309   LABSPEC 1.019 08/23/2019 1309   PHURINE 6.0 08/23/2019 1309   GLUCOSEU NEGATIVE 08/23/2019 1309   HGBUR NEGATIVE 08/23/2019 1309   BILIRUBINUR NEGATIVE 08/23/2019 1309   KETONESUR NEGATIVE 08/23/2019 1309   PROTEINUR NEGATIVE 08/23/2019 1309   NITRITE NEGATIVE 08/23/2019 1309   LEUKOCYTESUR SMALL (A) 08/23/2019 1309    FURTHER DISCHARGE INSTRUCTIONS:   Get Medicines reviewed and adjusted: Please take all your medications with you for your next visit with your Primary MD   Laboratory/radiological data: Please request your Primary MD to go over all hospital tests and procedure/radiological results at the follow up, please ask your  Primary MD to get all Hospital records sent to his/her office.   In some cases, they will be blood work, cultures and biopsy results pending at the time of your discharge. Please request that your primary care M.D. goes through all the records of your hospital data and follows up on these results.   Also Note the following: If you experience worsening of your admission symptoms, develop shortness of breath, life threatening emergency, suicidal or homicidal thoughts you must seek medical attention immediately by calling 911 or calling your MD immediately  if symptoms less severe.   You must read complete instructions/literature along with all the possible adverse reactions/side effects for all the Medicines you take and that have been prescribed to you. Take any new Medicines after you have completely understood and accpet all the possible adverse reactions/side effects.    Do not drive when taking Pain medications or sleeping medications (Benzodaizepines)   Do not take more than prescribed Pain, Sleep and Anxiety Medications. It is not advisable to combine anxiety,sleep and pain medications without talking with your primary care practitioner   Special Instructions: If you have smoked or chewed Tobacco  in the last 2 yrs please stop smoking, stop any regular Alcohol  and or any Recreational drug use.   Wear Seat belts while driving.   Please note: You were cared for by a hospitalist during your hospital stay. Once you are discharged, your primary care physician will handle any further medical issues. Please note that NO REFILLS for any discharge medications will be authorized once you are discharged, as it is imperative that you  return to your primary care physician (or establish a relationship with a primary care physician if you do not have one) for your post hospital discharge needs so that they can reassess your need for medications and monitor your lab values.  Time coordinating discharge: 40  minutes  SIGNED:  Marzetta Board, MD, PhD 09/12/2020, 10:12 AM

## 2020-09-12 NOTE — Progress Notes (Addendum)
      [address]       [place] [address]             [phone]       5 Days Post-Op Procedure(s) (LRB): VIDEO ASSISTED THORACOSCOPY (VATS)/THOROCOTOMY (Left) EMPYEMA DRAINAGE (Left) DECORTICATION (Left)  Subjective: Patient with nausea this am.  Objective: Vital signs in last 24 hours: Temp:  [97.9 F (36.6 C)-98.4 F (36.9 C)] 97.9 F (36.6 C) (02/16 0330) Pulse Rate:  [73-79] 79 (02/16 0330) Cardiac Rhythm: Normal sinus rhythm (02/15 2013) Resp:  [19-20] 20 (02/16 0330) BP: (151-169)/(83-102) 169/83 (02/16 0330) SpO2:  [93 %-98 %] 98 % (02/16 0330)     Intake/Output from previous day: 02/15 0701 - 02/16 0700 In: 120 [P.O.:120] Out: 0    Physical Exam:  Cardiovascular: RRR Pulmonary: Clear to auscultation on the right and diminished left basilar breath sounds Wounds:  There is minor driedbloody like ooze middle of incision and there is a very small space where there is no staple so superficially dehisced but no sign of infection.    Lab Results: CBC: Recent Labs    09/10/20 0026 09/11/20 0025  WBC 9.9 9.3  HGB 9.9* 9.4*  HCT 30.5* 29.4*  PLT 294 333   BMET:  Recent Labs    09/10/20 0026 09/11/20 0025  NA 138 137  K 4.0 3.6  CL 106 103  CO2 25 27  GLUCOSE 114* 102*  BUN 9 7*  CREATININE 0.61 0.59  CALCIUM 8.2* 8.3*    PT/INR: No results for input(s): LABPROT, INR in the last 72 hours. ABG:  INR: Will add last result for INR, ABG once components are confirmed Will add last 4 CBG results once components are confirmed  Assessment/Plan:  1. CV - SR and hypertensive. On Lisinopril 20 mg daily. Per primary. 2.  Pulmonary - On room air. CXR this am is stable.  Encourage incentive spirometer. 3. ID-on Augmentin. OR culture shows no growth to date 4. Anemia-H and H this am decreased to 9.4 and 29.4 5. Hydrocortisone PRN left neck (reg and itches) 6. Staples to be removed in the office;follow up appointment arranged. Will  see PRN  Lelon Huh [facility] 09/12/2020,7:31 AM [phone]   Chart reviewed, patient examined, agree with above. Nausea may be related to taking meds on empty stomach. CXR looks stable with mild left pleural thickening from edema. That will resolve with time. I don't think there is any significant loculated fluid. Lung aeration improving. She can go home on oral antibiotic and we will remove the staples and chest tube sutures in the office.

## 2020-09-12 NOTE — Progress Notes (Signed)
Pt provided with verbal discharge instructions. Son at bedside during d/c. RN answered all questions. VSS. IV removed per order. Pt belongings sent with patient son. RN discharge pt via wheelchair to [employer] entrance via wheelchair to private vehicle.

## 2020-09-12 NOTE — TOC Transition Note (Addendum)
Transition of Care [facility]) - CM/SW Discharge Note   Patient Details  Name: Donna Phelps MRN: [id number] Date of Birth: 1961-07-21  Transition of Care [facility]) CM/SW Contact:  Leone Haven, RN Phone Number: 09/12/2020, 12:51 PM   Clinical Narrative:    NCM spoke with patient at bedside, she lives alone, she states she has transportation home. She also states she has a friend who takes her to her MD apts.  NCM offered choice, she states she does not have preference for [facility] agency.  NCM made referral to [facility] with [facility] , awaiting call back. Pearson Grippe states they can take referral and they can also do HHRN.  NCM asked MD to add [facility].   Final next level of care: Home w Home Health Services Barriers to Discharge: No Barriers Identified   Patient Goals and CMS Choice Patient states their goals for this hospitalization and ongoing recovery are:: get better CMS Medicare.gov Compare Post Acute Care list provided to:: Patient Choice offered to / list presented to : Patient  Discharge Placement                       Discharge Plan and Services                  DME Agency: NA       HH Arranged: PT,RN HH Agency: [facility] Date HH Agency Contacted: 09/12/20 Time HH Agency Contacted: 1250 Representative spoke with at [facility] Agency: Pearson Grippe  Social Determinants of Health (SDOH) Interventions     Readmission Risk Interventions No flowsheet data found.

## 2020-09-25 ENCOUNTER — Other Ambulatory Visit: Payer: Self-pay | Admitting: Surgery

## 2020-09-25 DIAGNOSIS — J869 Pyothorax without fistula: Secondary | ICD-10-CM

## 2020-09-26 ENCOUNTER — Ambulatory Visit: Payer: Self-pay | Admitting: Surgery

## 2020-10-03 ENCOUNTER — Ambulatory Visit
Admission: RE | Admit: 2020-10-03 | Discharge: 2020-10-03 | Disposition: A | Discharge status: Home / Self Care | Payer: Medicaid Other | Source: Ambulatory Visit | Attending: Surgery | Admitting: Surgery

## 2020-10-03 ENCOUNTER — Ambulatory Visit: Payer: Self-pay | Admitting: Surgery

## 2020-10-03 DIAGNOSIS — J869 Pyothorax without fistula: Secondary | ICD-10-CM

## 2020-10-04 ENCOUNTER — Ambulatory Visit (INDEPENDENT_AMBULATORY_CARE_PROVIDER_SITE_OTHER): Payer: Self-pay | Admitting: Physician Assistant

## 2020-10-04 ENCOUNTER — Other Ambulatory Visit: Payer: Self-pay

## 2020-10-04 VITALS — BP 170/110 | HR 70 | Resp 20 | Ht 66.0 in | Wt 159.0 lb

## 2020-10-04 DIAGNOSIS — Z09 Encounter for follow-up examination after completed treatment for conditions other than malignant neoplasm: Secondary | ICD-10-CM

## 2020-10-04 DIAGNOSIS — J869 Pyothorax without fistula: Secondary | ICD-10-CM

## 2020-10-04 NOTE — Progress Notes (Signed)
  HPI: Patient returns for routine postoperative follow-up having undergone Left Thoracotomy with drainage of empyema and decortication of the lung on 09/07/2020.  Since hospital discharge the patient reports she is doing okay.  She has no specific complaints.  She denies pain and shortness of breath.  She denies experiencing any fevers.  She asks if I can remove her chest tube sutures/staples today.  She is ambulating independently.  Current Outpatient Medications  Medication Sig Dispense Refill  . acetaminophen (TYLENOL) 500 MG tablet Take 1,000 mg by mouth every 6 (six) hours as needed for mild pain.    Marland Kitchen albuterol (PROVENTIL HFA;VENTOLIN HFA) 108 (90 Base) MCG/ACT inhaler Inhale 2 puffs into the lungs every 6 (six) hours as needed for wheezing or shortness of breath.     . ferrous fumarate-b12-vitamic C-folic acid (TRINSICON / FOLTRIN) capsule Take 1 capsule by mouth 2 (two) times daily after a meal. 20 capsule 0  . lisinopril-hydrochlorothiazide (PRINZIDE,ZESTORETIC) 20-12.5 MG tablet Take 1 tablet by mouth daily.    . naproxen (NAPROSYN) 500 MG tablet Take 500 mg by mouth See admin instructions. Take 1 tablet (500mg ) twice daily for 2 weeks, then as needed.    omeprazole (PRILOSEC) 40 MG capsule Take 40 mg by mouth daily.    Marland Kitchen oxyCODONE (ROXICODONE) 5 MG immediate release tablet Take 1-2 tablets (5-10 mg total) by mouth every 4 (four) hours as needed for severe pain. 15 tablet 0  . enoxaparin (LOVENOX) 40 MG/0.4ML injection Inject 0.4 mLs (40 mg total) into the skin daily for 10 days. 4 mL 0   No current facility-administered medications for this visit.    Physical Exam:  BP (!) 170/110   Pulse 70   Resp 20   Ht 5\' 6"  (1.676 m)   Wt 159 lb (72.1 kg)   SpO2 98% Comment: RA  BMI 25.66 kg/m   Gen: no apparent distress Heart: RRR Lungs: CTA bilaterally Abd: soft non-tender, non-distended Incisions: well healed, staples and CT sutures remain in place w/o evidence of  infection  Diagnostic Tests:  CXR: resolution of pleural effusion, post surgical scarring, mild atelectasis present  A/P:  1. S/P VATS with drainage of empyema- doing very well, CXR shows resolution of previous pleural effusion, patient has remained afebrile since hospital discharge 2. CV- HTN- on Zestoretic at home, will need to have close follow up with PCP for BP monitoring 3. Staples and sutures removed, patient tolerated without difficulty 4. RTC prn  Marland Kitchen, PA-C [facility] [phone]

## 2020-10-08 ENCOUNTER — Ambulatory Visit: Payer: Self-pay

## 2020-10-10 LAB — FUNGUS CULTURE WITH STAIN

## 2020-10-10 LAB — FUNGUS CULTURE RESULT

## 2020-10-10 LAB — FUNGAL ORGANISM REFLEX

## 2020-10-24 LAB — ACID FAST CULTURE WITH REFLEXED SENSITIVITIES (MYCOBACTERIA): Acid Fast Culture: NEGATIVE

## 2021-01-23 ENCOUNTER — Other Ambulatory Visit: Payer: Self-pay | Admitting: Family Medicine

## 2021-01-23 DIAGNOSIS — Z1231 Encounter for screening mammogram for malignant neoplasm of breast: Secondary | ICD-10-CM

## 2021-02-21 ENCOUNTER — Other Ambulatory Visit
Admission: RE | Admit: 2021-02-21 | Discharge: 2021-02-21 | Disposition: A | Discharge status: Home / Self Care | Payer: Medicaid Other | Source: Ambulatory Visit | Attending: Sports Medicine | Admitting: Sports Medicine

## 2021-02-21 DIAGNOSIS — Z96641 Presence of right artificial hip joint: Secondary | ICD-10-CM | POA: Insufficient documentation

## 2021-02-21 DIAGNOSIS — R700 Elevated erythrocyte sedimentation rate: Secondary | ICD-10-CM | POA: Diagnosis present

## 2021-02-21 DIAGNOSIS — Z96649 Presence of unspecified artificial hip joint: Secondary | ICD-10-CM | POA: Insufficient documentation

## 2021-02-21 DIAGNOSIS — T8484XD Pain due to internal orthopedic prosthetic devices, implants and grafts, subsequent encounter: Secondary | ICD-10-CM | POA: Diagnosis present

## 2021-02-21 DIAGNOSIS — T84058A Periprosthetic osteolysis of other internal prosthetic joint, initial encounter: Secondary | ICD-10-CM | POA: Diagnosis present

## 2021-02-21 DIAGNOSIS — M25451 Effusion, right hip: Secondary | ICD-10-CM | POA: Insufficient documentation

## 2021-02-21 LAB — SYNOVIAL CELL COUNT + DIFF, W/ CRYSTALS
Crystals, Fluid: NONE SEEN
Eosinophils-Synovial: 0 %
Lymphocytes-Synovial Fld: 46 %
Monocyte-Macrophage-Synovial Fluid: 54 %
Neutrophil, Synovial: 0 %
WBC, Synovial: 1590 /mm3 — ABNORMAL HIGH (ref 0–200)

## 2021-02-28 ENCOUNTER — Other Ambulatory Visit: Payer: Self-pay | Admitting: Orthopedic Surgery

## 2021-02-28 ENCOUNTER — Other Ambulatory Visit (HOSPITAL_COMMUNITY): Payer: Self-pay | Admitting: Orthopedic Surgery

## 2021-02-28 DIAGNOSIS — T84058A Periprosthetic osteolysis of other internal prosthetic joint, initial encounter: Secondary | ICD-10-CM

## 2021-02-28 DIAGNOSIS — Z96649 Presence of unspecified artificial hip joint: Secondary | ICD-10-CM

## 2021-03-13 ENCOUNTER — Ambulatory Visit
Admission: RE | Admit: 2021-03-13 | Discharge: 2021-03-13 | Disposition: A | Discharge status: Home / Self Care | Payer: Medicaid Other | Source: Ambulatory Visit | Attending: Orthopedic Surgery | Admitting: Orthopedic Surgery

## 2021-03-13 ENCOUNTER — Other Ambulatory Visit: Payer: Self-pay

## 2021-03-13 DIAGNOSIS — T84058A Periprosthetic osteolysis of other internal prosthetic joint, initial encounter: Secondary | ICD-10-CM | POA: Insufficient documentation

## 2021-03-13 DIAGNOSIS — Z96649 Presence of unspecified artificial hip joint: Secondary | ICD-10-CM | POA: Insufficient documentation

## 2021-03-13 DIAGNOSIS — T8484XA Pain due to internal orthopedic prosthetic devices, implants and grafts, initial encounter: Secondary | ICD-10-CM | POA: Insufficient documentation

## 2021-08-02 ENCOUNTER — Other Ambulatory Visit: Payer: Self-pay | Admitting: Family Medicine

## 2021-08-02 DIAGNOSIS — Z1231 Encounter for screening mammogram for malignant neoplasm of breast: Secondary | ICD-10-CM

## 2021-09-10 ENCOUNTER — Other Ambulatory Visit: Payer: Self-pay | Admitting: Ophthalmology

## 2021-09-10 DIAGNOSIS — H21562 Pupillary abnormality, left eye: Secondary | ICD-10-CM

## 2021-09-20 ENCOUNTER — Other Ambulatory Visit: Payer: Self-pay | Admitting: Ophthalmology

## 2021-09-20 DIAGNOSIS — H21562 Pupillary abnormality, left eye: Secondary | ICD-10-CM

## 2021-10-08 ENCOUNTER — Other Ambulatory Visit: Payer: Medicaid Other

## 2022-09-25 ENCOUNTER — Encounter: Payer: Self-pay | Admitting: Family Medicine

## 2022-12-06 ENCOUNTER — Other Ambulatory Visit: Payer: Medicaid Other | Admitting: *Deleted

## 2022-12-06 ENCOUNTER — Other Ambulatory Visit: Payer: Self-pay | Admitting: Cardiology

## 2022-12-06 ENCOUNTER — Ambulatory Visit
Admission: RE | Admit: 2022-12-06 | Discharge: 2022-12-06 | Disposition: A | Discharge status: Home / Self Care | Payer: Medicaid Other | Source: Ambulatory Visit | Attending: Family Medicine | Admitting: Family Medicine

## 2022-12-06 ENCOUNTER — Other Ambulatory Visit: Payer: Self-pay | Admitting: Family Medicine

## 2022-12-06 DIAGNOSIS — Z1231 Encounter for screening mammogram for malignant neoplasm of breast: Secondary | ICD-10-CM

## 2022-12-06 DIAGNOSIS — Z13220 Encounter for screening for lipoid disorders: Secondary | ICD-10-CM

## 2022-12-06 LAB — AMB RESULTS CONSOLE CBG: Glucose: 98

## 2022-12-06 NOTE — Progress Notes (Signed)
Pt has PCP.Pt stated she did not want any SDOH resources despite marking positive on questionnaire.

## 2022-12-06 NOTE — Progress Notes (Signed)
Lipid profile screening completed. See cardiovascular screening flowsheet for patient history.

## 2022-12-10 LAB — LIPID PANEL W/O CHOL/HDL RATIO
Cholesterol, Total: 151 mg/dL (ref 100–199)
HDL: 55 mg/dL (ref >39)
LDL Chol Calc (NIH): 69 mg/dL (ref 0–99)
Triglycerides: 157 mg/dL — ABNORMAL HIGH (ref 0–149)
VLDL Cholesterol Cal: 27 mg/dL (ref 5–40)

## 2022-12-11 ENCOUNTER — Encounter: Payer: Self-pay | Admitting: *Deleted

## 2022-12-11 NOTE — Progress Notes (Signed)
Pt attended 12/06/22 screening event where her b/p was 118/85 and her blood sugar was 98. At the event, pt confirmed her PCP was at [facility], Dr. Beverely Low, and denied needing any resources for food insecurities despite her SDOH questionnaire that she has worried about running out of food in the past. Her PCP [facility] does offer services for [facility] and senior support services. No additional health equity team support indicated at this time.

## 2022-12-18 ENCOUNTER — Telehealth: Payer: Self-pay

## 2022-12-18 NOTE — Telephone Encounter (Signed)
Called patient via to give lab results from free screening event.  Total Cholesterol: 151 Triglycerides: 157 HDL Cholesterol: 55 LDL Cholesterol: 27   Spoke with patient about lab results. Informed patient that all values were within normal range except for her triglyceride levels. Counseled patient on practicing a heart healthy diet.Including watching the amount of fried and fatty foods consumed. Reducing the amount of processed foods consumed as well as sugary foods. Patient voiced understanding.

## 2023-03-06 IMAGING — CT CT HIP*R* W/O CM
3 of 5 series · 16 of 46 positions shown, 18 images · non-contrast
Comparison: X-ray 08/25/2019

CLINICAL DATA: Lateral right hip pain for 2 months. History of hip
arthroplasty on 08/25/2019

EXAM:
CT OF THE RIGHT HIP WITHOUT CONTRAST
TECHNIQUE: Multidetector CT imaging of the right hip was performed according to
the standard protocol. Multiplanar CT image reconstructions were
also generated. Metal artifact reduction protocol utilized.

[Series 4: axial st pelvis/hip 2.00 · axial · 0.40mm/px · z∈[-1608,-1416]mm · 9 of 121 slices shown, 11 images]
[im 13/121  soft-tissue]
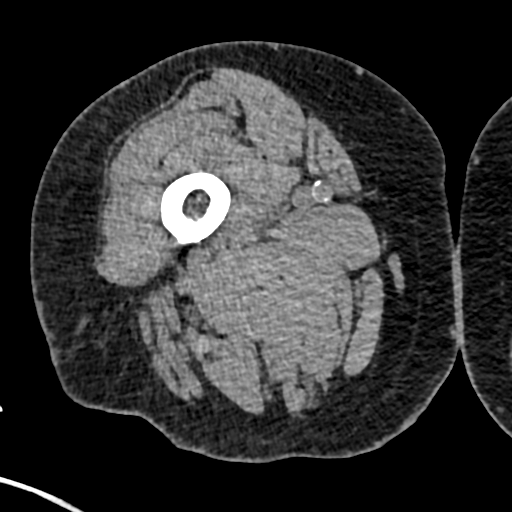
[im 13/121  bone]
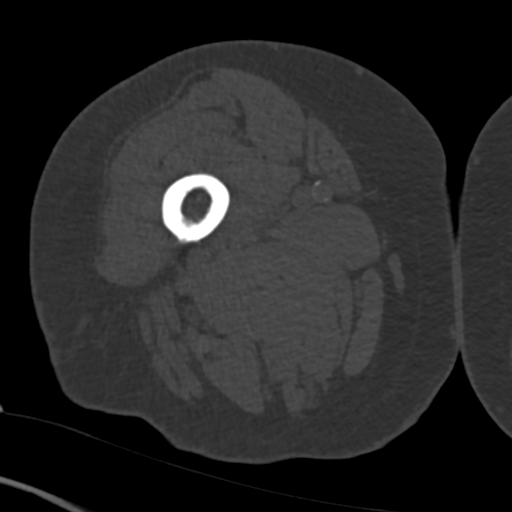
[im 25/121  soft-tissue]
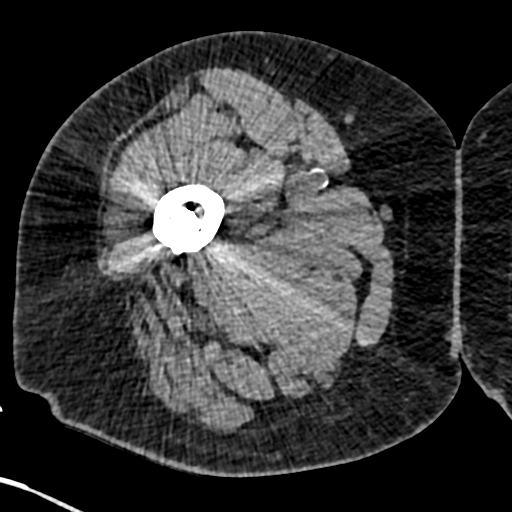
[im 37/121  soft-tissue]
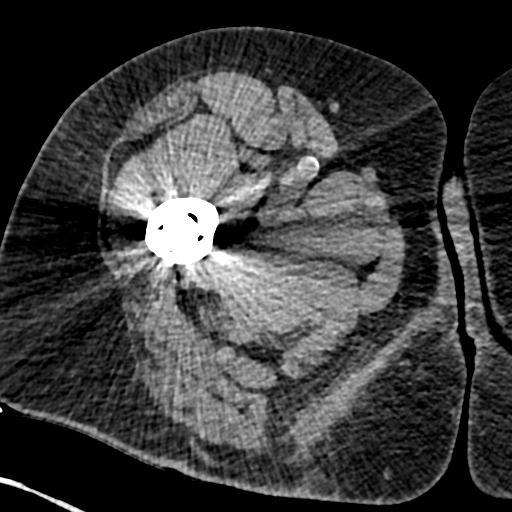
[im 49/121  soft-tissue]
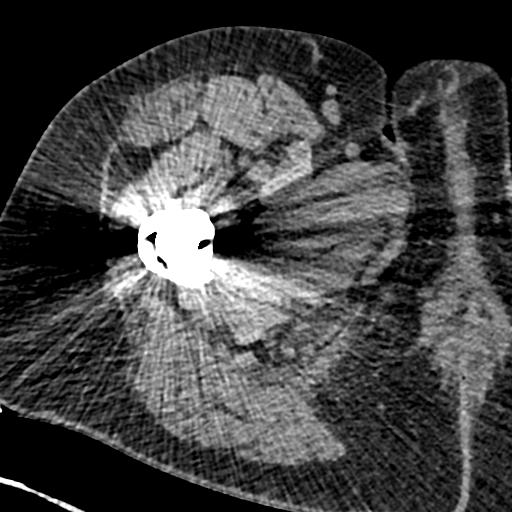
[im 61/121  soft-tissue]
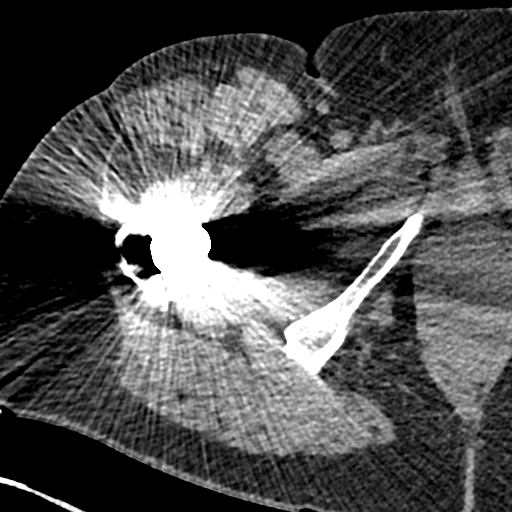
[im 73/121  soft-tissue]
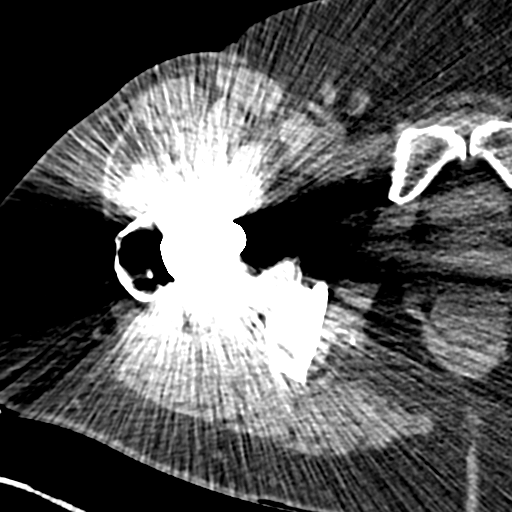
[im 85/121  soft-tissue]
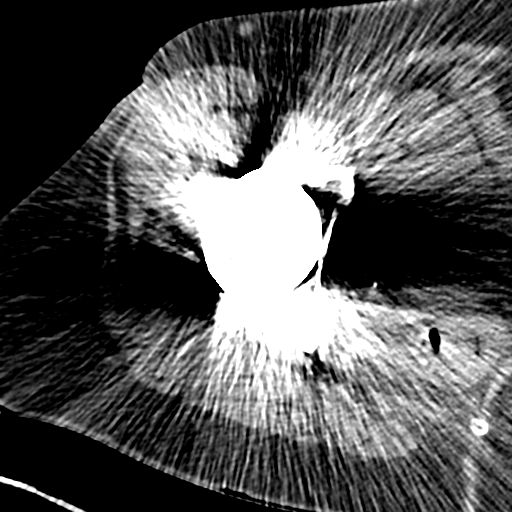
[im 97/121  soft-tissue]
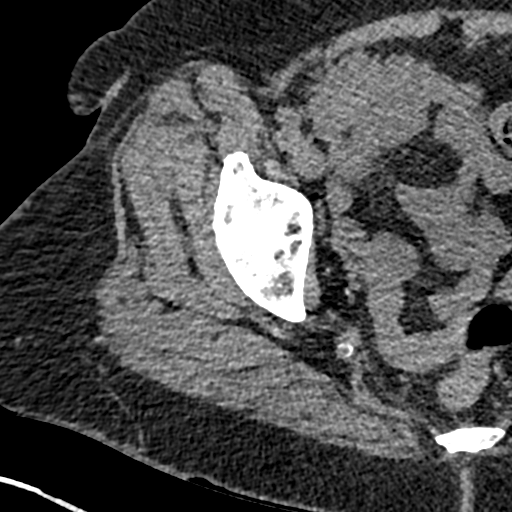
[im 109/121  soft-tissue]
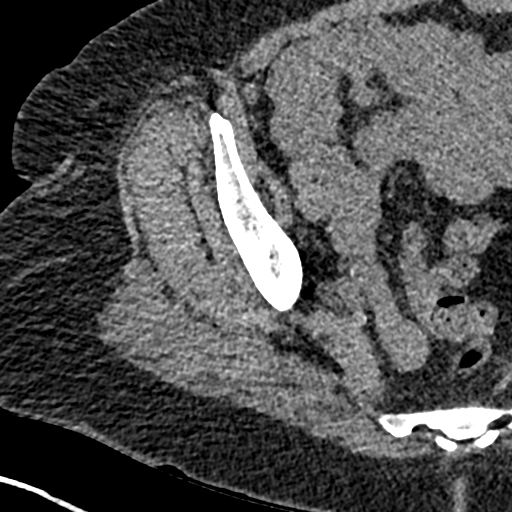
[im 109/121  bone]
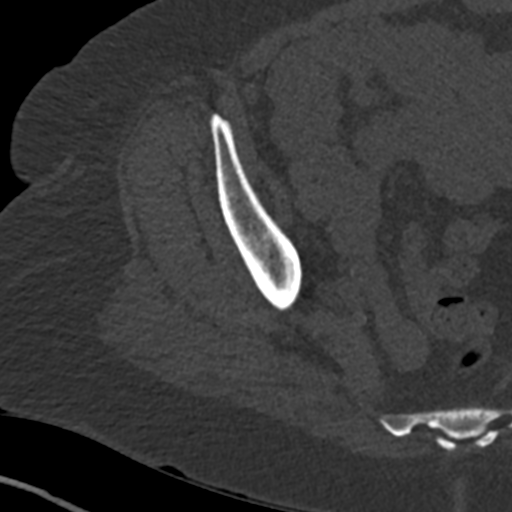

[Series 8: cor st pelvis/hip (person_name) 2.00 cor · coronal · 0.40mm/px · 3 of 103 slices shown]
[im 35/103  soft-tissue]
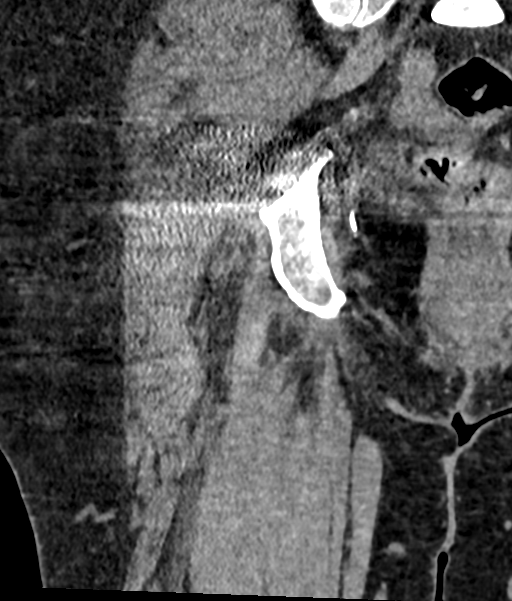
[im 46/103  soft-tissue]
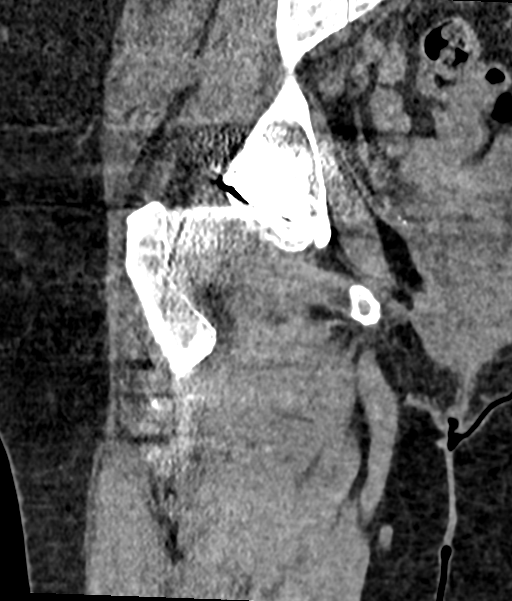
[im 57/103  soft-tissue]
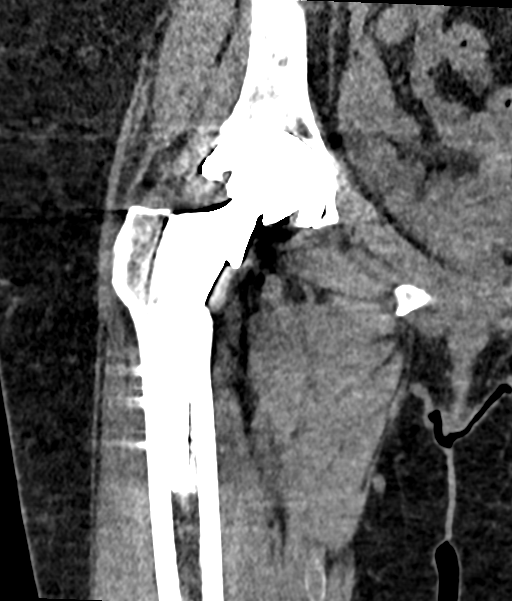

[Series 14: imaraxial st pelvis/hip (person_name) 2.00 · axial · 0.40mm/px · z∈[-1608,-1536]mm · 4 of 121 slices shown]
[im 13/121  soft-tissue]
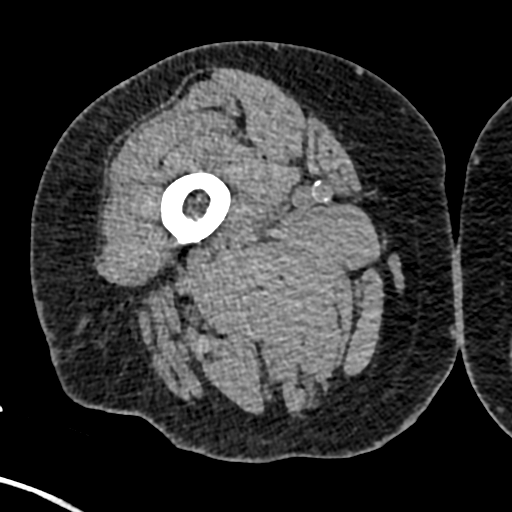
[im 25/121  soft-tissue]
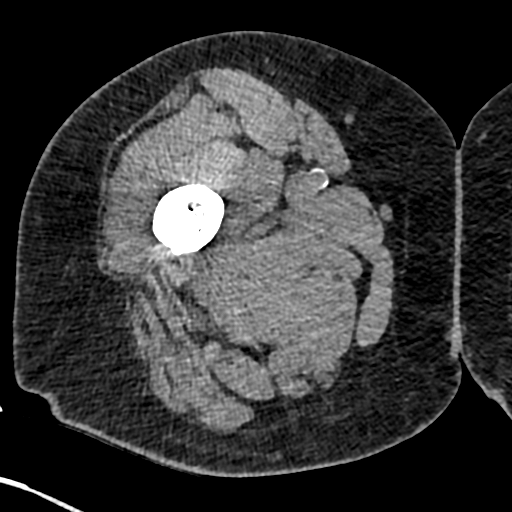
[im 37/121  soft-tissue]
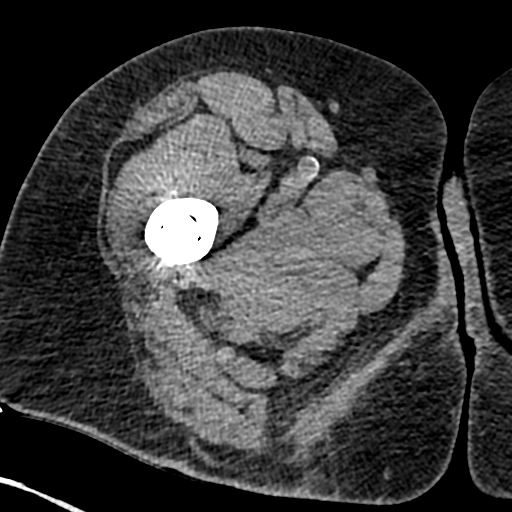
[im 49/121  soft-tissue]
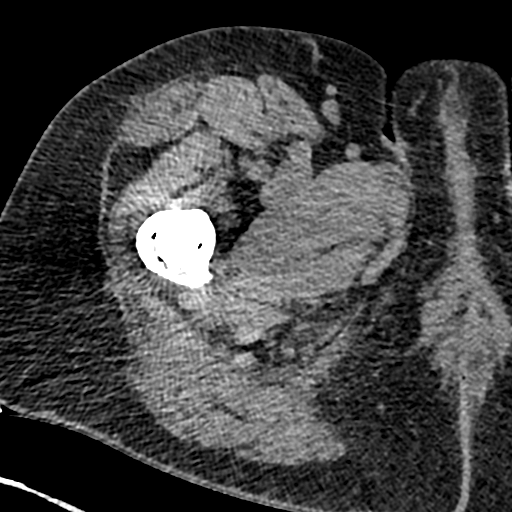

[16 of 46 positions shown; findings below may reference images not displayed]

FINDINGS: Bones/Joint/Cartilage

Status post right total hip arthroplasty. Arthroplasty components
are in their expected alignment without dislocation. No
periprosthetic fracture. Thin rim of periprosthetic lucency along
the posteromedial aspect of the acetabular cup, equivocal for
loosening (series 14, image 33). No periprosthetic lucency about the
femoral component. No periprosthetic fluid collection. Visualized
portion of the right hemipelvis is intact without fracture or
diastasis.

Ligaments

Suboptimally assessed by CT.

Muscles and Tendons

No acute musculotendinous abnormality by CT.  No muscle atrophy.

Soft tissues

No soft tissue edema or fluid collection. No right inguinal
lymphadenopathy.
IMPRESSION: 1. Status post right total hip arthroplasty without periprosthetic
fracture or dislocation. No periprosthetic fluid collection.
2. Thin rim of periprosthetic lucency along the posteromedial aspect
of the acetabular cup, equivocal for loosening.

## 2023-05-25 ENCOUNTER — Emergency Department (HOSPITAL_COMMUNITY): Payer: Medicaid Other

## 2023-05-25 ENCOUNTER — Emergency Department (HOSPITAL_COMMUNITY)
Admission: EM | Admit: 2023-05-25 | Discharge: 2023-05-26 | Disposition: A | Discharge status: Home / Self Care | Payer: Medicaid Other | Attending: Emergency Medicine | Admitting: Emergency Medicine

## 2023-05-25 DIAGNOSIS — S79912A Unspecified injury of left hip, initial encounter: Secondary | ICD-10-CM | POA: Diagnosis present

## 2023-05-25 DIAGNOSIS — Z79899 Other long term (current) drug therapy: Secondary | ICD-10-CM | POA: Insufficient documentation

## 2023-05-25 DIAGNOSIS — S7002XA Contusion of left hip, initial encounter: Secondary | ICD-10-CM | POA: Diagnosis not present

## 2023-05-25 DIAGNOSIS — I10 Essential (primary) hypertension: Secondary | ICD-10-CM | POA: Insufficient documentation

## 2023-05-25 LAB — CBC
HCT: 31 % — ABNORMAL LOW (ref 36.0–46.0)
Hemoglobin: 9.8 g/dL — ABNORMAL LOW (ref 12.0–15.0)
MCH: 26.5 pg (ref 26.0–34.0)
MCHC: 31.6 g/dL (ref 30.0–36.0)
MCV: 83.8 fL (ref 80.0–100.0)
Platelets: 252 10*3/uL (ref 150–400)
RBC: 3.7 MIL/uL — ABNORMAL LOW (ref 3.87–5.11)
RDW: 16.6 % — ABNORMAL HIGH (ref 11.5–15.5)
WBC: 7.3 10*3/uL (ref 4.0–10.5)
nRBC: 0 % (ref 0.0–0.2)

## 2023-05-25 LAB — COMPREHENSIVE METABOLIC PANEL
ALT: 14 U/L (ref 0–44)
AST: 23 U/L (ref 15–41)
Albumin: 3.6 g/dL (ref 3.5–5.0)
Alkaline Phosphatase: 51 U/L (ref 38–126)
Anion gap: 10 (ref 5–15)
BUN: 15 mg/dL (ref 8–23)
CO2: 26 mmol/L (ref 22–32)
Calcium: 9.4 mg/dL (ref 8.9–10.3)
Chloride: 102 mmol/L (ref 98–111)
Creatinine, Ser: 1.08 mg/dL — ABNORMAL HIGH (ref 0.44–1.00)
GFR, Estimated: 57 mL/min — ABNORMAL LOW (ref >60)
Glucose, Bld: 91 mg/dL (ref 70–99)
Potassium: 3.3 mmol/L — ABNORMAL LOW (ref 3.5–5.1)
Sodium: 138 mmol/L (ref 135–145)
Total Bilirubin: 0.5 mg/dL (ref 0.3–1.2)
Total Protein: 7.3 g/dL (ref 6.5–8.1)

## 2023-05-25 LAB — I-STAT CHEM 8, ED
BUN: 16 mg/dL (ref 8–23)
Calcium, Ion: 1.13 mmol/L — ABNORMAL LOW (ref 1.15–1.40)
Chloride: 102 mmol/L (ref 98–111)
Creatinine, Ser: 1.2 mg/dL — ABNORMAL HIGH (ref 0.44–1.00)
Glucose, Bld: 91 mg/dL (ref 70–99)
HCT: 32 % — ABNORMAL LOW (ref 36.0–46.0)
Hemoglobin: 10.9 g/dL — ABNORMAL LOW (ref 12.0–15.0)
Potassium: 3.4 mmol/L — ABNORMAL LOW (ref 3.5–5.1)
Sodium: 141 mmol/L (ref 135–145)
TCO2: 26 mmol/L (ref 22–32)

## 2023-05-25 LAB — SAMPLE TO BLOOD BANK

## 2023-05-25 LAB — I-STAT CG4 LACTIC ACID, ED: Lactic Acid, Venous: 1.3 mmol/L (ref 0.5–1.9)

## 2023-05-25 LAB — ETHANOL: Alcohol, Ethyl (B): <10 mg/dL (ref <10)

## 2023-05-25 LAB — PROTIME-INR
INR: 1.1 (ref 0.8–1.2)
Prothrombin Time: 14.4 s (ref 11.4–15.2)

## 2023-05-25 MED ORDER — IOHEXOL 350 MG/ML SOLN
75.0000 mL | Freq: Once | INTRAVENOUS | Status: AC | PRN
  Administered 2023-05-25: 75 mL via INTRAVENOUS

## 2023-05-25 MED ORDER — FENTANYL CITRATE PF 50 MCG/ML IJ SOSY
50.0000 ug | PREFILLED_SYRINGE | Freq: Once | INTRAMUSCULAR | Status: AC
  Administered 2023-05-25: 50 ug via INTRAVENOUS

## 2023-05-25 MED ORDER — FENTANYL CITRATE PF 50 MCG/ML IJ SOSY
PREFILLED_SYRINGE | INTRAMUSCULAR | Status: AC
  Filled 2023-05-25: qty 1

## 2023-05-25 NOTE — ED Notes (Signed)
Doctor noted some bruising on the sacral region when turning the patient. She was log rolled and a C-collar was applied during this exam
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
## 2023-05-26 MED ORDER — OXYCODONE-ACETAMINOPHEN 5-325 MG PO TABS
1.0000 | ORAL_TABLET | Freq: Once | ORAL | Status: AC
  Administered 2023-05-26: 1 via ORAL
  Filled 2023-05-26: qty 1

## 2023-05-26 NOTE — ED Notes (Signed)
Pt ambulatory to the wheelchair and bathroom with minimal assistance assistance

## 2024-06-21 ENCOUNTER — Emergency Department

## 2024-06-21 ENCOUNTER — Emergency Department
Admission: EM | Admit: 2024-06-21 | Discharge: 2024-06-21 | Disposition: A | Discharge status: Home / Self Care | Attending: Emergency Medicine | Admitting: Emergency Medicine

## 2024-06-21 ENCOUNTER — Other Ambulatory Visit: Payer: Self-pay

## 2024-06-21 DIAGNOSIS — R0789 Other chest pain: Secondary | ICD-10-CM | POA: Diagnosis present

## 2024-06-21 DIAGNOSIS — I10 Essential (primary) hypertension: Secondary | ICD-10-CM | POA: Insufficient documentation

## 2024-06-21 LAB — BASIC METABOLIC PANEL WITH GFR
Anion gap: 12 (ref 5–15)
BUN: 10 mg/dL (ref 8–23)
CO2: 27 mmol/L (ref 22–32)
Calcium: 9.9 mg/dL (ref 8.9–10.3)
Chloride: 100 mmol/L (ref 98–111)
Creatinine, Ser: 0.91 mg/dL (ref 0.44–1.00)
GFR, Estimated: >60 mL/min (ref >60)
Glucose, Bld: 96 mg/dL (ref 70–99)
Potassium: 3.5 mmol/L (ref 3.5–5.1)
Sodium: 139 mmol/L (ref 135–145)

## 2024-06-21 LAB — TROPONIN T, HIGH SENSITIVITY
Troponin T High Sensitivity: 20 ng/L — ABNORMAL HIGH (ref 0–19)
Troponin T High Sensitivity: 19 ng/L (ref 0–19)

## 2024-06-21 LAB — CBC
HCT: 31.4 % — ABNORMAL LOW (ref 36.0–46.0)
Hemoglobin: 9.8 g/dL — ABNORMAL LOW (ref 12.0–15.0)
MCH: 25.6 pg — ABNORMAL LOW (ref 26.0–34.0)
MCHC: 31.2 g/dL (ref 30.0–36.0)
MCV: 82 fL (ref 80.0–100.0)
Platelets: 295 K/uL (ref 150–400)
RBC: 3.83 MIL/uL — ABNORMAL LOW (ref 3.87–5.11)
RDW: 17.9 % — ABNORMAL HIGH (ref 11.5–15.5)
WBC: 6.4 K/uL (ref 4.0–10.5)
nRBC: 0 % (ref 0.0–0.2)

## 2024-06-21 MED ORDER — IBUPROFEN 600 MG PO TABS: 600.0000 mg | ORAL_TABLET | Freq: Four times a day (QID) | ORAL | 0 refills | Status: AC | PRN
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
## 2024-06-21 NOTE — ED Triage Notes (Addendum)
 Pt to ED via [facility] from home. EMS reports intermittent CP x2 days over right breast area. Non-radiating. Tender upon palpation. Denies trauma. Pt reports unsure how long pain has been present but is just tired of hurting and is why she called EMS today.   70 HR  144/86 98% RA 123 CBG

## 2024-06-21 NOTE — Discharge Instructions (Addendum)
 Take the ibuprofen  as needed for the pain.  Return to the ER for new, worsening, or persistent severe chest pain, difficulty breathing, weakness or lightheadedness, vomiting, or any other new or worsening symptoms that concern you.
